# Patient Record
Sex: Female | Born: 1980 | Race: White | Hispanic: No | Smoking: Current every day smoker
Health system: Southern US, Community
[De-identification: ages and names within clinical notes are randomized; demographics above are authoritative.]

## PROBLEM LIST (undated history)

## (undated) DIAGNOSIS — F1920 Other psychoactive substance dependence, uncomplicated: Secondary | ICD-10-CM

## (undated) DIAGNOSIS — R569 Unspecified convulsions: Secondary | ICD-10-CM

## (undated) DIAGNOSIS — F419 Anxiety disorder, unspecified: Secondary | ICD-10-CM

---

## 2000-12-12 ENCOUNTER — Other Ambulatory Visit: Admission: RE | Admit: 2000-12-12 | Discharge: 2000-12-12 | Payer: Self-pay | Admitting: Obstetrics and Gynecology

## 2002-11-13 ENCOUNTER — Emergency Department (HOSPITAL_COMMUNITY): Admission: EM | Admit: 2002-11-13 | Discharge: 2002-11-13 | Payer: Self-pay | Admitting: Emergency Medicine

## 2004-02-06 ENCOUNTER — Ambulatory Visit (HOSPITAL_COMMUNITY): Admission: RE | Admit: 2004-02-06 | Discharge: 2004-02-06 | Payer: Self-pay | Admitting: Internal Medicine

## 2004-11-28 ENCOUNTER — Emergency Department (HOSPITAL_COMMUNITY): Admission: EM | Admit: 2004-11-28 | Discharge: 2004-11-28 | Payer: Self-pay | Admitting: Emergency Medicine

## 2005-01-08 ENCOUNTER — Emergency Department (HOSPITAL_COMMUNITY): Admission: EM | Admit: 2005-01-08 | Discharge: 2005-01-08 | Payer: Self-pay | Admitting: Emergency Medicine

## 2005-02-23 ENCOUNTER — Emergency Department (HOSPITAL_COMMUNITY): Admission: EM | Admit: 2005-02-23 | Discharge: 2005-02-23 | Payer: Self-pay | Admitting: Emergency Medicine

## 2005-03-24 ENCOUNTER — Emergency Department (HOSPITAL_COMMUNITY): Admission: EM | Admit: 2005-03-24 | Discharge: 2005-03-24 | Payer: Self-pay | Admitting: Emergency Medicine

## 2005-05-11 ENCOUNTER — Emergency Department (HOSPITAL_COMMUNITY): Admission: EM | Admit: 2005-05-11 | Discharge: 2005-05-11 | Payer: Self-pay | Admitting: Emergency Medicine

## 2005-08-10 ENCOUNTER — Emergency Department (HOSPITAL_COMMUNITY): Admission: EM | Admit: 2005-08-10 | Discharge: 2005-08-10 | Payer: Self-pay | Admitting: Emergency Medicine

## 2006-12-31 ENCOUNTER — Emergency Department (HOSPITAL_COMMUNITY): Admission: EM | Admit: 2006-12-31 | Discharge: 2006-12-31 | Payer: Self-pay | Admitting: Emergency Medicine

## 2007-04-25 ENCOUNTER — Emergency Department (HOSPITAL_COMMUNITY): Admission: EM | Admit: 2007-04-25 | Discharge: 2007-04-26 | Payer: Self-pay | Admitting: Emergency Medicine

## 2007-04-30 ENCOUNTER — Other Ambulatory Visit: Admission: RE | Admit: 2007-04-30 | Discharge: 2007-04-30 | Payer: Self-pay | Admitting: Obstetrics and Gynecology

## 2007-09-11 ENCOUNTER — Ambulatory Visit: Payer: Self-pay | Admitting: Obstetrics and Gynecology

## 2007-09-11 ENCOUNTER — Inpatient Hospital Stay (HOSPITAL_COMMUNITY): Admission: AD | Admit: 2007-09-11 | Discharge: 2007-09-11 | Payer: Self-pay | Admitting: Gynecology

## 2007-09-19 ENCOUNTER — Ambulatory Visit: Payer: Self-pay | Admitting: Family Medicine

## 2007-09-19 ENCOUNTER — Encounter: Payer: Self-pay | Admitting: Obstetrics & Gynecology

## 2007-09-19 ENCOUNTER — Inpatient Hospital Stay (HOSPITAL_COMMUNITY): Admission: AD | Admit: 2007-09-19 | Discharge: 2007-09-21 | Payer: Self-pay | Admitting: Obstetrics & Gynecology

## 2007-12-08 ENCOUNTER — Emergency Department (HOSPITAL_COMMUNITY): Admission: EM | Admit: 2007-12-08 | Discharge: 2007-12-08 | Payer: Self-pay | Admitting: Emergency Medicine

## 2008-02-17 ENCOUNTER — Emergency Department (HOSPITAL_COMMUNITY): Admission: EM | Admit: 2008-02-17 | Discharge: 2008-02-17 | Payer: Self-pay | Admitting: Emergency Medicine

## 2008-05-18 ENCOUNTER — Emergency Department (HOSPITAL_COMMUNITY): Admission: EM | Admit: 2008-05-18 | Discharge: 2008-05-18 | Payer: Self-pay | Admitting: Emergency Medicine

## 2008-06-13 ENCOUNTER — Emergency Department (HOSPITAL_COMMUNITY): Admission: EM | Admit: 2008-06-13 | Discharge: 2008-06-13 | Payer: Self-pay | Admitting: Emergency Medicine

## 2008-06-14 ENCOUNTER — Emergency Department (HOSPITAL_COMMUNITY): Admission: EM | Admit: 2008-06-14 | Discharge: 2008-06-14 | Payer: Self-pay | Admitting: Emergency Medicine

## 2008-08-16 ENCOUNTER — Emergency Department (HOSPITAL_COMMUNITY): Admission: EM | Admit: 2008-08-16 | Discharge: 2008-08-16 | Payer: Self-pay | Admitting: Emergency Medicine

## 2010-05-12 ENCOUNTER — Emergency Department (HOSPITAL_COMMUNITY)
Admission: EM | Admit: 2010-05-12 | Discharge: 2010-05-13 | Disposition: A | Discharge status: Home / Self Care | Payer: Medicaid Other | Attending: Emergency Medicine | Admitting: Emergency Medicine

## 2010-05-12 DIAGNOSIS — N390 Urinary tract infection, site not specified: Secondary | ICD-10-CM | POA: Insufficient documentation

## 2010-05-12 DIAGNOSIS — M549 Dorsalgia, unspecified: Secondary | ICD-10-CM | POA: Insufficient documentation

## 2010-05-12 LAB — URINALYSIS, ROUTINE W REFLEX MICROSCOPIC
Bilirubin Urine: NEGATIVE
Glucose, UA: NEGATIVE mg/dL
Hgb urine dipstick: NEGATIVE
Ketones, ur: NEGATIVE mg/dL
Nitrite: NEGATIVE
Protein, ur: NEGATIVE mg/dL
Specific Gravity, Urine: 1.015 (ref 1.005–1.030)
Urobilinogen, UA: 0.2 mg/dL (ref 0.0–1.0)

## 2010-05-12 LAB — URINE MICROSCOPIC-ADD ON

## 2010-05-14 ENCOUNTER — Emergency Department (HOSPITAL_COMMUNITY)
Admission: EM | Admit: 2010-05-14 | Discharge: 2010-05-14 | Disposition: A | Discharge status: Home / Self Care | Payer: Medicaid Other | Attending: Emergency Medicine | Admitting: Emergency Medicine

## 2010-05-14 DIAGNOSIS — R109 Unspecified abdominal pain: Secondary | ICD-10-CM | POA: Insufficient documentation

## 2010-05-14 DIAGNOSIS — O2390 Unspecified genitourinary tract infection in pregnancy, unspecified trimester: Secondary | ICD-10-CM | POA: Insufficient documentation

## 2010-05-14 LAB — URINALYSIS, ROUTINE W REFLEX MICROSCOPIC
Bilirubin Urine: NEGATIVE
Glucose, UA: NEGATIVE mg/dL
Hgb urine dipstick: NEGATIVE
Ketones, ur: NEGATIVE mg/dL
Nitrite: NEGATIVE
Protein, ur: NEGATIVE mg/dL
Specific Gravity, Urine: 1.015 (ref 1.005–1.030)
Urobilinogen, UA: 0.2 mg/dL (ref 0.0–1.0)
pH: 7 (ref 5.0–8.0)

## 2010-05-14 LAB — CBC
MCH: 30.6 pg (ref 26.0–34.0)
MCHC: 34.4 g/dL (ref 30.0–36.0)
MCV: 89.1 fL (ref 78.0–100.0)
Platelets: 198 10*3/uL (ref 150–400)
RBC: 3.66 MIL/uL — ABNORMAL LOW (ref 3.87–5.11)
RDW: 13.6 % (ref 11.5–15.5)
WBC: 11.2 10*3/uL — ABNORMAL HIGH (ref 4.0–10.5)

## 2010-05-14 LAB — DIFFERENTIAL
Basophils Absolute: 0 10*3/uL (ref 0.0–0.1)
Basophils Relative: 0 % (ref 0–1)
Eosinophils Absolute: 0.2 10*3/uL (ref 0.0–0.7)
Eosinophils Relative: 2 % (ref 0–5)
Lymphs Abs: 1.9 10*3/uL (ref 0.7–4.0)
Monocytes Absolute: 0.4 10*3/uL (ref 0.1–1.0)
Monocytes Relative: 4 % (ref 3–12)
Neutro Abs: 8.7 10*3/uL — ABNORMAL HIGH (ref 1.7–7.7)
Neutrophils Relative %: 77 % (ref 43–77)

## 2010-05-14 LAB — BASIC METABOLIC PANEL
BUN: 5 mg/dL — ABNORMAL LOW (ref 6–23)
CO2: 20 mEq/L (ref 19–32)
Chloride: 107 mEq/L (ref 96–112)
Creatinine, Ser: 0.4 mg/dL (ref 0.4–1.2)
GFR calc Af Amer: >60 mL/min (ref >60)
GFR calc non Af Amer: >60 mL/min (ref >60)
Glucose, Bld: 85 mg/dL (ref 70–99)
Potassium: 3.8 mEq/L (ref 3.5–5.1)
Sodium: 136 mEq/L (ref 135–145)

## 2010-05-14 LAB — PREGNANCY, URINE: Preg Test, Ur: POSITIVE

## 2010-05-14 LAB — URINE MICROSCOPIC-ADD ON

## 2010-05-15 LAB — URINE CULTURE
Colony Count: NO GROWTH
Culture  Setup Time: 201203270234
Culture: NO GROWTH

## 2010-05-17 ENCOUNTER — Emergency Department (HOSPITAL_COMMUNITY)
Admission: EM | Admit: 2010-05-17 | Discharge: 2010-05-17 | Discharge status: Against Medical Advice | Payer: Medicaid Other | Attending: Emergency Medicine | Admitting: Emergency Medicine

## 2010-05-17 DIAGNOSIS — O355XX0 Maternal care for (suspected) damage to fetus by drugs, not applicable or unspecified: Secondary | ICD-10-CM | POA: Insufficient documentation

## 2010-05-17 LAB — DIFFERENTIAL
Basophils Absolute: 0 10*3/uL (ref 0.0–0.1)
Basophils Relative: 0 % (ref 0–1)
Eosinophils Absolute: 0.2 10*3/uL (ref 0.0–0.7)
Eosinophils Relative: 2 % (ref 0–5)
Lymphocytes Relative: 19 % (ref 12–46)
Lymphs Abs: 2.6 10*3/uL (ref 0.7–4.0)
Monocytes Absolute: 0.6 10*3/uL (ref 0.1–1.0)
Neutro Abs: 9.9 10*3/uL — ABNORMAL HIGH (ref 1.7–7.7)
Neutrophils Relative %: 74 % (ref 43–77)

## 2010-05-17 LAB — CBC
HCT: 31.6 % — ABNORMAL LOW (ref 36.0–46.0)
Hemoglobin: 11 g/dL — ABNORMAL LOW (ref 12.0–15.0)
MCH: 31 pg (ref 26.0–34.0)
MCHC: 34.8 g/dL (ref 30.0–36.0)
Platelets: 218 10*3/uL (ref 150–400)
RBC: 3.55 MIL/uL — ABNORMAL LOW (ref 3.87–5.11)
RDW: 13.6 % (ref 11.5–15.5)
WBC: 13.3 10*3/uL — ABNORMAL HIGH (ref 4.0–10.5)

## 2010-05-17 LAB — BASIC METABOLIC PANEL
CO2: 23 mEq/L (ref 19–32)
Calcium: 8.8 mg/dL (ref 8.4–10.5)
Chloride: 103 mEq/L (ref 96–112)
Creatinine, Ser: 0.38 mg/dL — ABNORMAL LOW (ref 0.4–1.2)
GFR calc Af Amer: >60 mL/min (ref >60)
GFR calc non Af Amer: >60 mL/min (ref >60)
Glucose, Bld: 75 mg/dL (ref 70–99)
Potassium: 3.9 mEq/L (ref 3.5–5.1)
Sodium: 135 mEq/L (ref 135–145)

## 2010-05-17 LAB — RAPID URINE DRUG SCREEN, HOSP PERFORMED
Amphetamines: NOT DETECTED
Barbiturates: NOT DETECTED
Benzodiazepines: POSITIVE — AB
Cocaine: NOT DETECTED
Opiates: NOT DETECTED
Tetrahydrocannabinol: NOT DETECTED

## 2010-05-31 LAB — URINALYSIS, ROUTINE W REFLEX MICROSCOPIC
Bilirubin Urine: NEGATIVE
Glucose, UA: NEGATIVE mg/dL
Ketones, ur: NEGATIVE mg/dL
Nitrite: NEGATIVE
Protein, ur: NEGATIVE mg/dL
Specific Gravity, Urine: >1.03 — ABNORMAL HIGH (ref 1.005–1.030)
Urobilinogen, UA: 0.2 mg/dL (ref 0.0–1.0)
pH: 5.5 (ref 5.0–8.0)

## 2010-05-31 LAB — PREGNANCY, URINE: Preg Test, Ur: NEGATIVE

## 2010-05-31 LAB — WET PREP, GENITAL
Trich, Wet Prep: NONE SEEN
Yeast Wet Prep HPF POC: NONE SEEN

## 2010-05-31 LAB — URINE MICROSCOPIC-ADD ON

## 2010-06-07 ENCOUNTER — Other Ambulatory Visit (HOSPITAL_COMMUNITY)
Admission: RE | Admit: 2010-06-07 | Discharge: 2010-06-07 | Disposition: A | Discharge status: Home / Self Care | Payer: Medicaid Other | Source: Ambulatory Visit | Attending: Obstetrics and Gynecology | Admitting: Obstetrics and Gynecology

## 2010-06-07 ENCOUNTER — Other Ambulatory Visit: Payer: Self-pay | Admitting: Obstetrics and Gynecology

## 2010-06-07 DIAGNOSIS — Z361 Encounter for antenatal screening for raised alphafetoprotein level: Secondary | ICD-10-CM

## 2010-06-07 DIAGNOSIS — Z01419 Encounter for gynecological examination (general) (routine) without abnormal findings: Secondary | ICD-10-CM | POA: Insufficient documentation

## 2010-06-07 DIAGNOSIS — Z113 Encounter for screening for infections with a predominantly sexual mode of transmission: Secondary | ICD-10-CM | POA: Insufficient documentation

## 2010-06-12 ENCOUNTER — Ambulatory Visit (HOSPITAL_COMMUNITY): Payer: Self-pay | Attending: Obstetrics and Gynecology

## 2010-07-03 NOTE — Discharge Summary (Signed)
NAMEBRITTLEY, Shelly George                 ACCOUNT NO.:  [id number]   MEDICAL RECORD NO.:  [id number]          PATIENT TYPE:  INP   LOCATION:  9143                          FACILITY:  [facility]   PHYSICIAN:  Odie Sera, DO      DATE OF BIRTH:  04-22-1980   DATE OF ADMISSION:  09/19/2007  DATE OF DISCHARGE:  09/21/2007                               DISCHARGE SUMMARY   REASON FOR HOSPITALIZATION:  A preterm diamniotic-dichorionic twin  gestation, pregnancy with premature preterm rupture of membranes.   HOSPITAL COURSE:  The patient is a 30 year old now gravida 2, para 0-1-1-  2, who presented early in the morning on September 19, 2007, at 35-4/7  weeks' gestational age with report of a rupture of membranes.  She was  confirmed to have premature preterm ruptured membranes.  She was mildly  contracting at that time.  She was actively managed and her labor was  augmented with Pitocin.  When her cervix was completely dilated and  effaced, she began pushing efforts.  When the presenting twin was close  delivery, she was moved to the operating room where she delivered twin A  in a vertex occiput anterior position.  A small midline episiotomy was  cut due to exhaustive maternal efforts and a tight introitus.  Twin A  delivered without complications, and had a spontaneous cry upon birth.  He was bulb suctioned, the cord was clamped and cut, and he was handed  to the NICU Team.  An ultrasound was used to confirm persistent breech  presentation of twin B.  An internal podalic version was successfully  performed on twin B.  The feet were identified and brought down to the  vaginal opening.  During this process, the membranes were ruptured with  clear fluids.  The feet were then delivered followed by the hips, the  shoulders, and then the head was delivered with extension in the usual  manner.  Twin B was a female and had a spontaneous cry.  She was bulb  suctioned, the cord was clamped and cut, and she was  handed to the NICU  Team.  Both placentas delivered spontaneously, and intact, and were sent  to pathology.  A cord clamp was placed on twin B's placenta to allow for  identification of the placentas.  The midline episiotomy did not extend,  was repaired in the usual manner with 3-0 Vicryl.  Twin A weighed 2205 g  and had Apgars of 7 and 9.  Twin B had a weight of 1880 g and had Apgars  of 8 and 9.  The estimated blood loss of the delivery was 500 mL.  Mother was in good condition at the end of the procedure and the babies  were taken to the NICU for further evaluation.  The patient's postpartum  course was uncomplicated.  At discharge, her abdominal pain is minimal.  However, she has complained of some lower back and leg pain, mostly  related to a twin pregnancy, at the end of the pregnancy, as well as all  the efforts of labor and delivery.  She has minimal lochia, is  ambulating and tolerating oral diet, and is passing flatus.   PERTINENT LABORATORY DATA:  Blood type O positive, GBS initially was  unknown, then later found to be negative.  Her rubella is not immune,  and she will require an immunization after discharge.   FINAL DIAGNOSES:  1. Preterm diamniotic-dichorionic twin gestation pregnancy.  2. Premature preterm rupture of membranes.  3. A spontaneous vaginal delivery of twin babies.  4. Internal podalic version with breech extraction.   SIGNIFICANT FINDINGS:  None.   PROCEDURES PERFORMED:  Spontaneous vaginal delivery and internal podalic  version with breech extraction.   Condition upon discharge is good.   SPECIFIC INDICATIONS:  The patient received a Depo-Provera shot before  discharge.  She will follow up with [facility] in 6 weeks for routine  postpartum visit.  A routine instructions relating to physical activity,  medication, and diet were given to the patient.  Additionally, she was  given 20 tablets of Percocet for hip and back pain and instructed that  if  the pain persisted beyond a few days, she should seek followup with  her providers at [facility].   FINAL NOTE:  The twin B female is in the NICU at the time of discharge  and twin A has remained with the patient, so the patient will room-in  with her female baby for the time being.      Odie Sera, DO  Electronically Signed     MC/MEDQ  D:  09/21/2007  T:  09/22/2007  Job:  [id number]

## 2010-08-31 ENCOUNTER — Inpatient Hospital Stay (HOSPITAL_COMMUNITY)
Admission: AD | Admit: 2010-08-31 | Discharge: 2010-08-31 | Disposition: A | Discharge status: Home / Self Care | Payer: Medicaid Other | Source: Ambulatory Visit | Attending: Obstetrics and Gynecology | Admitting: Obstetrics and Gynecology

## 2010-08-31 ENCOUNTER — Emergency Department (HOSPITAL_COMMUNITY)
Admission: EM | Admit: 2010-08-31 | Discharge: 2010-08-31 | Disposition: A | Discharge status: Home / Self Care | Payer: Medicaid Other | Source: Home / Self Care | Attending: Emergency Medicine | Admitting: Emergency Medicine

## 2010-08-31 ENCOUNTER — Encounter (HOSPITAL_COMMUNITY): Payer: Self-pay | Admitting: *Deleted

## 2010-08-31 ENCOUNTER — Encounter: Payer: Self-pay | Admitting: Emergency Medicine

## 2010-08-31 ENCOUNTER — Emergency Department (HOSPITAL_COMMUNITY): Payer: Medicaid Other

## 2010-08-31 DIAGNOSIS — O479 False labor, unspecified: Secondary | ICD-10-CM

## 2010-08-31 DIAGNOSIS — J069 Acute upper respiratory infection, unspecified: Secondary | ICD-10-CM | POA: Insufficient documentation

## 2010-08-31 DIAGNOSIS — O471 False labor at or after 37 completed weeks of gestation: Secondary | ICD-10-CM

## 2010-08-31 DIAGNOSIS — F17200 Nicotine dependence, unspecified, uncomplicated: Secondary | ICD-10-CM | POA: Insufficient documentation

## 2010-08-31 HISTORY — DX: Anxiety disorder, unspecified: F41.9

## 2010-08-31 HISTORY — DX: Other psychoactive substance dependence, uncomplicated: F19.20

## 2010-08-31 MED ORDER — ZOLPIDEM TARTRATE 10 MG PO TABS
10.0000 mg | ORAL_TABLET | Freq: Every evening | ORAL | Status: DC | PRN
  Administered 2010-08-31: 10 mg via ORAL
  Filled 2010-08-31: qty 1

## 2010-08-31 NOTE — ED Provider Notes (Signed)
History      Chief Complaint  Patient presents with  . Cough   HPI  Per pt, c/o gradual onset and persistence of constant runny/stuffy nose, ears and sinus congestion, and cough x2 weeks.  Pt was eval by her OB/GYN last week for same, rx abx and "cough syrup" without relief.  States she is currently at term for her pregnancy and is "due today."  Denies CP/SOB, no fevers, no abd/pelvic pain, no N/V/D, no flank pain, no rash.   History reviewed. No pertinent past medical history.  History reviewed. No pertinent past surgical history.  History reviewed. No pertinent family history.  History  Substance Use Topics  . Smoking status: Current Everyday Smoker -- 0.5 packs/day  . Smokeless tobacco: Not on file  . Alcohol Use: No    OB History    Grav Para Term Preterm Abortions TAB SAB Ect Mult Living   1               Review of Systems  ROS: Statement: All systems negative except as marked or noted in the HPI; Constitutional: Negative for fever and chills. ; ; Eyes: Negative for eye pain and discharge. ; ; ENMT: Negative for ear pain, hoarseness, and sore throat, +nasal congestion, +sinus pressure. ; ; Cardiovascular: Negative for chest pain, palpitations, diaphoresis, dyspnea and peripheral edema. ; ; Respiratory: +cough, Negative for SOB, wheezing and stridor. ; ; Gastrointestinal: Negative for nausea, vomiting, diarrhea and abdominal pain. ; ; Genitourinary: Negative for dysuria, flank pain and hematuria. ; ; Musculoskeletal: Negative for back pain and neck pain. ; ; Skin: Negative for rash and skin lesion. ; ; Neuro: Negative for headache, lightheadedness and neck stiffness. ;    Physical Exam  BP 122/87  Pulse 111  Temp(Src) 97.9 F (36.6 C) (Oral)  Resp 20  Ht 5\' 6"  (1.676 m)  Wt 137 lb (62.143 kg)  BMI 22.11 kg/m2  SpO2 100%  Physical Exam  Physical examination: Vital signs and O2 SAT: Reviewed; Constitutional: Well developed, Well nourished, Well hydrated, In no acute  distress; Eyes: EOMI, PERRL, No scleral icterus; ENMT: Mouth and pharynx normal, Mucous membranes mois, +edemetous nasal turbinates bilat with clear rhinorrhea, +clear fluid levels behind TM's bilat.; Neck: Supple, Full range of motion, No lymphadenopathy; Cardiovascular: Regular rate and rhythm, No murmur, rub, or gallop; Respiratory: Breath sounds clear & equal bilaterally, resps easy, speaking full sentences with ease.  No rales, rhonchi, wheezes, or rub, Normal respiratory effort/excursion; Chest: Nontender, Movement normal; Abdomen: Soft, Nontender, Nondistended, Normal bowel sounds, +gravid; Genitourinary: No CVA tenderness; Extremities: Pulses normal, No tenderness, No edema, No calf edema or asymmetry.; Neuro: AA&Ox3, Major CN grossly intact.  No gross focal motor or sensory deficits in extremities, gait steady.; Skin: Color normal, Warm, Dry   ED Course  Procedures  Dx testing d/w pt.  Questions answered.  Verb understanding, agreeable to d/c home with outpt f/u.         Laray Anger, MD 08/31/10 [phone]

## 2010-08-31 NOTE — Progress Notes (Signed)
Pt states, " I was on the way here because I felt like I was in drug withdrawal. The last time I had Sivoxin wasthree weeks ago. I had a cough med for the past few days with hydrocodone in it. I started having contractions on the way here."

## 2010-08-31 NOTE — ED Provider Notes (Signed)
History    R/o labor.  Chief Complaint  Patient presents with  . Labor Eval  patient presented earlier with contractions q 3' mild to moderate. No bleeding or srom.   HPI   Past Medical History  Diagnosis Date  . Anxiety   . Drug abuse and dependence     No past surgical history on file.  Family History  Problem Relation Age of Onset  . Cancer Mother     History  Substance Use Topics  . Smoking status: Current Everyday Smoker -- 0.5 packs/day  . Smokeless tobacco: Not on file  . Alcohol Use: No    Allergies: No Known Allergies  Prescriptions prior to admission  Medication Sig Dispense Refill  . buprenorphine-naloxone (SUBOXONE) 2-0.5 MG SUBL Place 1 tablet under the tongue daily as needed.        Marland Kitchen HYDROcodone-homatropine (HYDROMET) 5-1.5 MG/5ML syrup Take 5 mLs by mouth every 6 (six) hours as needed.        . hydrocortisone (ANUSOL-HC) 2.5 % rectal cream Place 1 application rectally 2 (two) times daily.          ROS Physical Exam   Blood pressure 106/66, pulse 120, temperature 98.5 F (36.9 C), temperature source Oral, resp. rate 20, height 5\' 3"  (1.6 m), weight 62.143 kg (137 lb).  Physical Examalert oriented anxious, tolerating labor adequately.  Initial exam by Dr Natale Milch revealed cervix 2cm/50%/-2 station. Vertex.  Negative CST criteria met.  Allowed to labor, and recheck now at 8 p.m. Shows no change in cervix exam.  Contractions remain mild, indentable.  Will D/C home with dx: False Labor.  MAU Course  Procedures  MDM  R/o labor

## 2010-08-31 NOTE — ED Notes (Signed)
In pts room for assessment. Pt declined EFM at this time, plans to smoke

## 2010-08-31 NOTE — ED Notes (Signed)
Pt reports productive cough with green colored phlegm x 3 days.

## 2010-11-16 LAB — URINALYSIS, ROUTINE W REFLEX MICROSCOPIC
Glucose, UA: NEGATIVE
Hgb urine dipstick: NEGATIVE
Ketones, ur: >80 — AB
Protein, ur: NEGATIVE

## 2010-11-16 LAB — STREP B DNA PROBE: Strep Group B Ag: NEGATIVE

## 2010-11-16 LAB — CBC
Hemoglobin: 13.1
Platelets: 147 — ABNORMAL LOW
RDW: 14.1

## 2010-11-16 LAB — ABO/RH: ABO/RH(D): O POS

## 2010-11-16 LAB — TYPE AND SCREEN
ABO/RH(D): O POS
Antibody Screen: NEGATIVE

## 2010-11-27 LAB — RAPID URINE DRUG SCREEN, HOSP PERFORMED
Amphetamines: POSITIVE — AB
Barbiturates: NOT DETECTED
Benzodiazepines: POSITIVE — AB
Tetrahydrocannabinol: NOT DETECTED

## 2010-11-27 LAB — BASIC METABOLIC PANEL
CO2: 22
Calcium: 9.2
Creatinine, Ser: 0.72
GFR calc Af Amer: >60
Glucose, Bld: 107 — ABNORMAL HIGH

## 2010-11-27 LAB — DIFFERENTIAL
Eosinophils Absolute: 0.2
Eosinophils Relative: 2
Lymphocytes Relative: 32
Lymphs Abs: 3.3
Monocytes Absolute: 0.6
Monocytes Relative: 6

## 2010-11-27 LAB — VALPROIC ACID LEVEL: Valproic Acid Lvl: <10 — ABNORMAL LOW

## 2010-11-27 LAB — CBC: Platelets: 184

## 2010-11-27 LAB — PREGNANCY, URINE: Preg Test, Ur: NEGATIVE

## 2010-12-04 ENCOUNTER — Encounter (HOSPITAL_COMMUNITY): Payer: Self-pay | Admitting: *Deleted

## 2010-12-04 NOTE — Progress Notes (Signed)
Encounter addended by: Caffie Pinto, RN on: 12/04/2010  5:11 PM<BR>     Documentation filed: Episodes

## 2011-11-13 ENCOUNTER — Encounter (HOSPITAL_COMMUNITY): Payer: Self-pay | Admitting: Emergency Medicine

## 2011-11-13 ENCOUNTER — Emergency Department (HOSPITAL_COMMUNITY)
Admission: EM | Admit: 2011-11-13 | Discharge: 2011-11-13 | Disposition: A | Discharge status: Home / Self Care | Payer: Self-pay | Attending: Emergency Medicine | Admitting: Emergency Medicine

## 2011-11-13 DIAGNOSIS — Z046 Encounter for general psychiatric examination, requested by authority: Secondary | ICD-10-CM | POA: Insufficient documentation

## 2011-11-13 DIAGNOSIS — F1910 Other psychoactive substance abuse, uncomplicated: Secondary | ICD-10-CM

## 2011-11-13 DIAGNOSIS — F1110 Opioid abuse, uncomplicated: Secondary | ICD-10-CM | POA: Insufficient documentation

## 2011-11-13 DIAGNOSIS — Z139 Encounter for screening, unspecified: Secondary | ICD-10-CM

## 2011-11-13 DIAGNOSIS — F17200 Nicotine dependence, unspecified, uncomplicated: Secondary | ICD-10-CM | POA: Insufficient documentation

## 2011-11-13 DIAGNOSIS — Z888 Allergy status to other drugs, medicaments and biological substances status: Secondary | ICD-10-CM | POA: Insufficient documentation

## 2011-11-13 LAB — BASIC METABOLIC PANEL
Calcium: 9.8 mg/dL (ref 8.4–10.5)
Chloride: 103 mEq/L (ref 96–112)
Creatinine, Ser: 0.65 mg/dL (ref 0.50–1.10)
GFR calc Af Amer: >90 mL/min (ref >90)
Sodium: 138 mEq/L (ref 135–145)

## 2011-11-13 LAB — CBC WITH DIFFERENTIAL/PLATELET
Basophils Absolute: 0 10*3/uL (ref 0.0–0.1)
Basophils Relative: 0 % (ref 0–1)
HCT: 42.3 % (ref 36.0–46.0)
Lymphocytes Relative: 40 % (ref 12–46)
MCHC: 34.5 g/dL (ref 30.0–36.0)
Monocytes Absolute: 0.3 10*3/uL (ref 0.1–1.0)
Neutro Abs: 3.7 10*3/uL (ref 1.7–7.7)
Platelets: 241 10*3/uL (ref 150–400)
RDW: 12.8 % (ref 11.5–15.5)
WBC: 6.9 10*3/uL (ref 4.0–10.5)

## 2011-11-13 LAB — PREGNANCY, URINE: Preg Test, Ur: NEGATIVE

## 2011-11-13 LAB — RAPID URINE DRUG SCREEN, HOSP PERFORMED
Amphetamines: POSITIVE — AB
Cocaine: NOT DETECTED
Opiates: NOT DETECTED

## 2011-11-13 NOTE — ED Notes (Signed)
Pt was seen at [facility] for detox treatment, but her blood pressure 151/106 and needs medical clearance before she can be sent to [facility]. Pt states she takes opioids and last used on Friday.

## 2011-11-13 NOTE — BH Assessment (Signed)
Assessment Note  Shelly George is an 31 y.o. female. Patient sent to ED for medical clearance after being seen at [name]. She was in the process of being referred to [name], when the process was stopped. She had an elevated blood pressure. Called [facility] and spoke with Shelly George an RN in admissions. She said the patient did not have a bed because [name] not [facility] the referral. She explained that she could return to [name] 11/14/11 and start the process over. She was willing to talk with ACT, if we had completed an assessment. When we went to talk to Shelly George she was angry and refused to talk with [facility]. She said she only wanted her discharge paperwork and to return to [name]. She would not discuss anything else with this Clinical research associate. Axis I: Substance Abuse Axis II: Deferred Axis III:  Past Medical History  Diagnosis Date  . Anxiety   . Drug abuse and dependence    Axis IV: problems with access to health care services and problems with primary support group Axis V: 41-50 serious symptoms  Past Medical History:  Past Medical History  Diagnosis Date  . Anxiety   . Drug abuse and dependence     History reviewed. No pertinent past surgical history.  Family History:  Family History  Problem Relation Age of Onset  . Cancer Mother     Social History:  reports that she has been smoking.  She does not have any smokeless tobacco history on file. She reports that she uses illicit drugs (Oxycodone). She reports that she does not drink alcohol.  Additional Social History:     CIWA: CIWA-Ar BP: 144/99 mmHg Pulse Rate: 102  COWS:    Allergies:  Allergies  Allergen Reactions  . Tegretol (Carbamazepine) Nausea Only    Home Medications:  (Not in a George admission)  OB/GYN Status:  Patient's last menstrual period was 11/10/2011.  General Assessment Data Location of Assessment: [facility] ACT Assessment: Yes     Risk to self Suicidal Ideation: No Suicidal Intent: No Is  patient at risk for suicide?: No Suicidal Plan?: No Access to Means: No What has been your use of drugs/alcohol within the last 12 months?: opiates Previous Attempts/Gestures: No Substance abuse history and/or treatment for substance abuse?: Yes  Risk to Others Homicidal Ideation: No Thoughts of Harm to Others: No Current Homicidal Intent: No Current Homicidal Plan: No History of harm to others?: No  Psychosis Hallucinations: None noted Delusions: None noted  Mental Status Report Appear/Hygiene: Improved Eye Contact: Fair Motor Activity: Freedom of movement Speech: Argumentative Level of Consciousness: Alert Mood: Angry;Irritable Affect: Angry;Irritable              Prior Outpatient Therapy Prior Outpatient Therapy: Yes Prior Therapy Dates: today ([facility] at [name]) Prior Therapy Facilty/Provider(s): [name] Reason for Treatment: detox            Values / Beliefs Cultural Requests During Hospitalization: None Spiritual Requests During Hospitalization: None        Additional Information Does patient have medical clearance?: Yes     Disposition: Explained patient's request to Shelly George and  That patient had refused to talk with this writer. Ptient will return to [name] at her request. Disposition Disposition of Patient: Other dispositions;Treatment offered and refused Other disposition(s): Other (Comment) (patient refused to be seen, stated she wanted to return to [facility])  On Site Evaluation by:  Reviewed with Physician:     Shelly George Shelly George 11/13/2011 3:27 PM

## 2011-11-13 NOTE — ED Provider Notes (Signed)
History     CSN: [id number]  Arrival date & time 11/13/11  1135   First MD Initiated Contact with Patient 11/13/11 1316      Chief Complaint  Patient presents with  . Hypertension  . V70.1     HPI Pt was seen at 1340.  Per pt, c/o gradual onset and persistence of constant substance abuse for many years.  States she was eval at [facility] for admission to [facility] for detox from suboxone and opiates PTA, and was sent to the ED for eval of "my blood pressure."  LD of suboxone and opiates 5 days ago. Denies CP/SOB, no abd pain, no N/V/D, no SI.     Past Medical History  Diagnosis Date  . Anxiety   . Drug abuse and dependence     History reviewed. No pertinent past surgical history.  Family History  Problem Relation Age of Onset  . Cancer Mother     History  Substance Use Topics  . Smoking status: Current Every Day Smoker -- 0.5 packs/day  . Smokeless tobacco: Not on file  . Alcohol Use: No    OB History    Grav Para Term Preterm Abortions TAB SAB Ect Mult Living   3 2  1     1 2       Review of Systems ROS: Statement: All systems negative except as marked or noted in the HPI; Constitutional: Negative for fever and chills. ; ; Eyes: Negative for eye pain, redness and discharge. ; ; ENMT: Negative for ear pain, hoarseness, nasal congestion, sinus pressure and sore throat. ; ; Cardiovascular: Negative for chest pain, palpitations, diaphoresis, dyspnea and peripheral edema. ; ; Respiratory: Negative for cough, wheezing and stridor. ; ; Gastrointestinal: Negative for nausea, vomiting, diarrhea, abdominal pain, blood in stool, hematemesis, jaundice and rectal bleeding. . ; ; Genitourinary: Negative for dysuria, flank pain and hematuria. ; ; Musculoskeletal: Negative for back pain and neck pain. Negative for swelling and trauma.; ; Skin: Negative for pruritus, rash, abrasions, blisters, bruising and skin lesion.; ; Neuro: Negative for headache, lightheadedness and neck stiffness. Negative  for weakness, altered level of consciousness , altered mental status, extremity weakness, paresthesias, involuntary movement, seizure and syncope.; Psych:  No SI, no SA, no HI, no hallucinations.   Allergies  Tegretol  Home Medications   Current Outpatient Rx  Name Route Sig Dispense Refill  . ETONOGESTREL 68 MG [place] IMPL Subcutaneous Inject 1 each into the skin once.      BP 144/99  Pulse 102  Temp 98.7 F (37.1 C) (Oral)  Resp 17  SpO2 100%  LMP 11/10/2011  Physical Exam 1345: Physical examination:  Nursing notes reviewed; Vital signs and O2 SAT reviewed;  Constitutional: Well developed, Well nourished, Well hydrated, In no acute distress; Head:  Normocephalic, atraumatic; Eyes: EOMI, PERRL, No scleral icterus; ENMT: Mouth and pharynx normal, Mucous membranes moist; Neck: Supple, Full range of motion, No lymphadenopathy; Cardiovascular: Regular rate and rhythm, No murmur, rub, or gallop; Respiratory: Breath sounds clear & equal bilaterally, No rales, rhonchi, wheezes.  Speaking full sentences with ease, Normal respiratory effort/excursion; Chest: Nontender, Movement normal; Abdomen: Soft, Nontender, Nondistended, Normal bowel sounds;; Extremities: Pulses normal, No tenderness, No edema, No calf edema or asymmetry.; Neuro: AA&Ox3, Major CN grossly intact.  Speech clear. No gross focal motor or sensory deficits in extremities.; Skin: Color normal, Warm, Dry.; Psych:  Calm, cooperative, no SI, no HI.     ED Course  Procedures    MDM  MDM Reviewed: nursing note and vitals Interpretation: labs     Results for orders placed during the hospital encounter of 11/13/11  CBC WITH DIFFERENTIAL      Component Value Range   WBC 6.9  4.0 - 10.5 K/uL   RBC 4.95  3.87 - 5.11 MIL/uL   Hemoglobin 14.6  12.0 - 15.0 g/dL   HCT 40.9  81.1 - 91.4 %   MCV 85.5  78.0 - 100.0 fL   MCH 29.5  26.0 - 34.0 pg   MCHC 34.5  30.0 - 36.0 g/dL   RDW 78.2  95.6 - 21.3 %   Platelets 241  150 - 400 K/uL     Neutrophils Relative 54  43 - 77 %   Neutro Abs 3.7  1.7 - 7.7 K/uL   Lymphocytes Relative 40  12 - 46 %   Lymphs Abs 2.7  0.7 - 4.0 K/uL   Monocytes Relative 5  3 - 12 %   Monocytes Absolute 0.3  0.1 - 1.0 K/uL   Eosinophils Relative 1  0 - 5 %   Eosinophils Absolute 0.1  0.0 - 0.7 K/uL   Basophils Relative 0  0 - 1 %   Basophils Absolute 0.0  0.0 - 0.1 K/uL  BASIC METABOLIC PANEL      Component Value Range   Sodium 138  135 - 145 mEq/L   Potassium 3.6  3.5 - 5.1 mEq/L   Chloride 103  96 - 112 mEq/L   CO2 23  19 - 32 mEq/L   Glucose, Bld 82  70 - 99 mg/dL   BUN 8  6 - 23 mg/dL   Creatinine, Ser 0.86  0.50 - 1.10 mg/dL   Calcium 9.8  8.4 - 57.8 mg/dL   GFR calc non Af Amer >90  >90 mL/min   GFR calc Af Amer >90  >90 mL/min  URINE RAPID DRUG SCREEN (HOSP PERFORMED)      Component Value Range   Opiates NONE DETECTED  NONE DETECTED   Cocaine NONE DETECTED  NONE DETECTED   Benzodiazepines NONE DETECTED  NONE DETECTED   Amphetamines POSITIVE (*) NONE DETECTED   Tetrahydrocannabinol NONE DETECTED  NONE DETECTED   Barbiturates NONE DETECTED  NONE DETECTED  ETHANOL      Component Value Range   Alcohol, Ethyl (B) <11  0 - 11 mg/dL  PREGNANCY, URINE      Component Value Range   Preg Test, Ur NEGATIVE  NEGATIVE     1530:  ACT Tommy attempted to eval pt; she does not want to speak with him nor have the ED assist her with placement at [facility].  States she wants to go back to [facility] for "all that" and wants to leave now.  No outward signs of opiate withdrawal at this time.  BP 130/90's on my re-eval.  Denies SI.  Encouraged to f/u with [facility] and PMD.  Verb understanding.          Laray Anger, DO 11/15/11 1247

## 2011-11-13 NOTE — ED Notes (Signed)
Pt denies any si/hi thoughts at this time.

## 2011-11-14 ENCOUNTER — Encounter (HOSPITAL_COMMUNITY): Payer: Self-pay | Admitting: Emergency Medicine

## 2011-11-14 ENCOUNTER — Emergency Department (HOSPITAL_COMMUNITY)
Admission: EM | Admit: 2011-11-14 | Discharge: 2011-11-14 | Disposition: A | Discharge status: Home / Self Care | Payer: Self-pay | Attending: Emergency Medicine | Admitting: Emergency Medicine

## 2011-11-14 DIAGNOSIS — Z0389 Encounter for observation for other suspected diseases and conditions ruled out: Secondary | ICD-10-CM | POA: Insufficient documentation

## 2011-11-14 LAB — RAPID URINE DRUG SCREEN, HOSP PERFORMED
Barbiturates: NOT DETECTED
Cocaine: NOT DETECTED
Opiates: NOT DETECTED
Tetrahydrocannabinol: NOT DETECTED

## 2011-11-14 NOTE — ED Notes (Signed)
Pt stated to nt joyce that she wished to sign herself out. rn entered room and asked pt if she was si/hi she stated no, just needed detox but was ready to go and wanted to sign herself out.  Pt signed form and left without incident.

## 2011-11-14 NOTE — ED Notes (Signed)
Pt states she was detox from opioids and she last used Friday. Pt denies any si/hi.

## 2013-03-12 ENCOUNTER — Emergency Department (HOSPITAL_COMMUNITY)
Admission: EM | Admit: 2013-03-12 | Discharge: 2013-03-12 | Disposition: A | Discharge status: Home / Self Care | Payer: Managed Care, Other (non HMO) | Attending: Emergency Medicine | Admitting: Emergency Medicine

## 2013-03-12 ENCOUNTER — Encounter (HOSPITAL_COMMUNITY): Payer: Self-pay | Admitting: Emergency Medicine

## 2013-03-12 DIAGNOSIS — F17200 Nicotine dependence, unspecified, uncomplicated: Secondary | ICD-10-CM | POA: Insufficient documentation

## 2013-03-12 DIAGNOSIS — W57XXXA Bitten or stung by nonvenomous insect and other nonvenomous arthropods, initial encounter: Secondary | ICD-10-CM

## 2013-03-12 DIAGNOSIS — IMO0001 Reserved for inherently not codable concepts without codable children: Secondary | ICD-10-CM | POA: Insufficient documentation

## 2013-03-12 DIAGNOSIS — Y939 Activity, unspecified: Secondary | ICD-10-CM | POA: Insufficient documentation

## 2013-03-12 DIAGNOSIS — Y929 Unspecified place or not applicable: Secondary | ICD-10-CM | POA: Insufficient documentation

## 2013-03-12 DIAGNOSIS — Z8659 Personal history of other mental and behavioral disorders: Secondary | ICD-10-CM | POA: Insufficient documentation

## 2013-03-12 MED ORDER — FAMOTIDINE 20 MG PO TABS
20.0000 mg | ORAL_TABLET | Freq: Once | ORAL | Status: AC
  Administered 2013-03-12: 20 mg via ORAL
  Filled 2013-03-12: qty 1

## 2013-03-12 MED ORDER — PREDNISONE 50 MG PO TABS
60.0000 mg | ORAL_TABLET | Freq: Once | ORAL | Status: AC
  Administered 2013-03-12: 60 mg via ORAL
  Filled 2013-03-12 (×2): qty 1

## 2013-03-12 MED ORDER — PREDNISONE 20 MG PO TABS: ORAL_TABLET | ORAL | Status: DC

## 2013-03-12 MED ORDER — FAMOTIDINE 20 MG PO TABS: 20.0000 mg | ORAL_TABLET | Freq: Two times a day (BID) | ORAL | Status: DC

## 2013-03-12 NOTE — ED Notes (Signed)
Pt c/o abscess to left arm x 2 days.

## 2013-03-12 NOTE — ED Provider Notes (Signed)
CSN: [id number]     Arrival date & time 03/12/13  0740 History   First MD Initiated Contact with Patient 03/12/13 [phone]     Chief Complaint  Patient presents with  . Abscess   (Consider location/radiation/quality/duration/timing/severity/associated sxs/prior Treatment) HPI Comments: Shelly George is a 33 y.o. female who presents to the Emergency Department complaining of possible insect bite to the left elbow.  She c/o itching and redness to her elbow that began yesterday.  She states that she felt something bite or sting her when she put her jacket on.  She has taken benadryl and cleaned the area with hydrogen peroxide.  She denies swelling, pain with movement, numbness, fever, chills or other bites or rashes  The history is provided by the patient.    Past Medical History  Diagnosis Date  . Anxiety   . Drug abuse and dependence     opiates, suboxone   History reviewed. No pertinent past surgical history. Family History  Problem Relation Age of Onset  . Cancer Mother    History  Substance Use Topics  . Smoking status: Current Every Day Smoker -- 0.50 packs/day  . Smokeless tobacco: Not on file  . Alcohol Use: No   OB History   Grav Para Term Preterm Abortions TAB SAB Ect Mult Living   3 2  1     1 2      Review of Systems  Constitutional: Negative for fever and chills.  HENT: Negative for trouble swallowing.   Gastrointestinal: Negative for nausea and vomiting.  Musculoskeletal: Negative for arthralgias, joint swelling and myalgias.  Skin: Positive for color change.       Red, itching area to the left elbow  Neurological: Negative for dizziness.  Hematological: Negative for adenopathy.  All other systems reviewed and are negative.    Allergies  Tegretol  Home Medications   Current Outpatient Rx  Name  Route  Sig  Dispense  Refill  . etonogestrel (IMPLANON) 68 MG IMPL implant   Subcutaneous   Inject 1 each into the skin once.          BP 147/84  Pulse 105   Temp(Src) 98.5 F (36.9 C)  Resp 16  Ht 5\' 4"  (1.626 m)  Wt 112 lb (50.803 kg)  BMI 19.22 kg/m2  SpO2 99%  LMP 02/19/2013 Physical Exam  Nursing note and vitals reviewed. Constitutional: She is oriented to person, place, and time. She appears well-developed and well-nourished. No distress.  HENT:  Head: Normocephalic and atraumatic.  Mouth/Throat: Oropharynx is clear and moist.  Neck: Normal range of motion. Neck supple.  Cardiovascular: Normal rate, regular rhythm, normal heart sounds and intact distal pulses.   No murmur heard. Pulmonary/Chest: Effort normal and breath sounds normal. No respiratory distress.  Musculoskeletal: Normal range of motion. She exhibits no edema and no tenderness.  Pt has full ROM of the left elbow.  No edema of the joint.  Radial pulse brisk, distal sensation intact  Lymphadenopathy:    She has no cervical adenopathy.  Neurological: She is alert and oriented to person, place, and time. She exhibits normal muscle tone. Coordination normal.  Skin: Skin is warm. Rash noted. Rash is urticarial. Rash is not macular, not papular, not nodular, not pustular and not vesicular. There is erythema.     Localized 5 cm area of erythema to the lateral left elbow and forearm.  No edema.  No pustule, vesicles or drainage.    ED Course  Procedures (including critical  care time) Labs Review Labs Reviewed - No data to display Imaging Review No results found.  EKG Interpretation   None       MDM   area of erythema to the left lateral elbow appears c/w localized allergic reaction, no abscess.  Pt agrees to benadryl, prednisone taper and pepcid.  No edema or airway compromise.  Pt stable for d/c.  Agrees to return if needed.   Luevenia Mcavoy L. Devi Hopman, PA-C 03/14/13 1344

## 2013-03-12 NOTE — Discharge Instructions (Signed)
Insect Bite °Mosquitoes, flies, fleas, bedbugs, and other insects can bite. Insect bites are different from insect stings. The bite may be red, puffy (swollen), and itchy for 2 to 4 days. Most bites get better on their own. °HOME CARE  °· Do not scratch the bite. °· Keep the bite clean and dry. Wash the bite with soap and water. °· Put ice on the bite. °· Put ice in a plastic bag. °· Place a towel between your skin and the bag. °· Leave the ice on for 20 minutes, 4 times a day. Do this for the first 2 to 3 days, or as told by your doctor. °· You may use medicated lotions or creams to lessen itching as told by your doctor. °· Only take medicines as told by your doctor. °· If you are given medicines (antibiotics), take them as told. Finish them even if you start to feel better. °You may need a tetanus shot if: °· You cannot remember when you had your last tetanus shot. °· You have never had a tetanus shot. °· The injury broke your skin. °If you need a tetanus shot and you choose not to have one, you may get tetanus. Sickness from tetanus can be serious. °GET HELP RIGHT AWAY IF:  °· You have more pain, redness, or puffiness. °· You see a red line on the skin coming from the bite. °· You have a fever. °· You have joint pain. °· You have a headache or neck pain. °· You feel weak. °· You have a rash. °· You have chest pain, or you are short of breath. °· You have belly (abdominal) pain. °· You feel sick to your stomach (nauseous) or throw up (vomit). °· You feel very tired or sleepy. °MAKE SURE YOU:  °· Understand these instructions. °· Will watch your condition. °· Will get help right away if you are not doing well or get worse. °Document Released: 02/02/2000 Document Revised: 04/29/2011 Document Reviewed: 09/05/2010 °[employer]® Patient Information ©2014 [employer]. ° °

## 2013-03-15 NOTE — ED Provider Notes (Signed)
Medical screening examination/treatment/procedure(s) were performed by non-physician practitioner and as supervising physician I was immediately available for consultation/collaboration.  EKG Interpretation   None         Tiffinie Caillier M Gabino Hagin, DO 03/15/13 0711 

## 2013-03-25 ENCOUNTER — Emergency Department (HOSPITAL_COMMUNITY)
Admission: EM | Admit: 2013-03-25 | Discharge: 2013-03-26 | Disposition: A | Discharge status: Home / Self Care | Payer: Managed Care, Other (non HMO) | Attending: Emergency Medicine | Admitting: Emergency Medicine

## 2013-03-25 ENCOUNTER — Encounter (HOSPITAL_COMMUNITY): Payer: Self-pay | Admitting: Emergency Medicine

## 2013-03-25 DIAGNOSIS — F419 Anxiety disorder, unspecified: Secondary | ICD-10-CM

## 2013-03-25 DIAGNOSIS — F445 Conversion disorder with seizures or convulsions: Secondary | ICD-10-CM

## 2013-03-25 DIAGNOSIS — R569 Unspecified convulsions: Secondary | ICD-10-CM | POA: Insufficient documentation

## 2013-03-25 DIAGNOSIS — F17200 Nicotine dependence, unspecified, uncomplicated: Secondary | ICD-10-CM | POA: Insufficient documentation

## 2013-03-25 DIAGNOSIS — F411 Generalized anxiety disorder: Secondary | ICD-10-CM | POA: Insufficient documentation

## 2013-03-25 DIAGNOSIS — IMO0002 Reserved for concepts with insufficient information to code with codable children: Secondary | ICD-10-CM | POA: Insufficient documentation

## 2013-03-25 HISTORY — DX: Unspecified convulsions: R56.9

## 2013-03-25 MED ORDER — LORAZEPAM 1 MG PO TABS
1.0000 mg | ORAL_TABLET | Freq: Once | ORAL | Status: AC
  Administered 2013-03-25: 1 mg via ORAL
  Filled 2013-03-25: qty 1

## 2013-03-25 NOTE — ED Provider Notes (Signed)
CSN: [id number]     Arrival date & time 03/25/13  2236 History  This chart was scribed for Lyanne CoKevin M Danie Diehl, MD by Quintella ReichertMatthew Underwood, ED scribe.  This patient was seen in room APA19/APA19 and the patient's care was started at 11:41 PM.   Chief Complaint  Patient presents with  . Seizures    The history is provided by the patient. No language interpreter was used.    HPI Comments: Shelly George is a 33 y.o. female brought in by ambulance to the Emergency Department complaining of a "stress-induced seizure" that occurred prior to arrival.  Pt states she has h/o the same and is stressed currently due to relationship issues.  She describes the seizure as initially "jerking and shaking" in both arms, and later developing slurred speech.  She denies LOC.  Currently she sates she feels generally weak and mildly dizzy.  She denies focal weakness or pain to any area.  Pt states her seizure tonight was similar to prior stress-induced seizures.  She states she was not formally diagnosed with stress-induced seizures but has had an EEG and MRI done which did not show anything, and states they only come on when she is stressed.  She does not have epilepsy to her knowledge.  She was on Depakote but was taken off.  She does not see a neurologist or psychiatrist or take any psychiatric medications.  She is requesting "something to help me so I don't keep doing this."     Past Medical History  Diagnosis Date  . Anxiety   . Drug abuse and dependence     opiates, suboxone  . Seizure     History reviewed. No pertinent past surgical history.   Family History  Problem Relation Age of Onset  . Cancer Mother     History  Substance Use Topics  . Smoking status: Current Every Day Smoker -- 0.50 packs/day  . Smokeless tobacco: Not on file  . Alcohol Use: No    OB History   Grav Para Term Preterm Abortions TAB SAB Ect Mult Living   3 2  1     1 2       Review of Systems A complete 10 system review of  systems was obtained and all systems are negative except as noted in the HPI and PMH.    Allergies  Codeine and Tegretol  Home Medications   Current Outpatient Rx  Name  Route  Sig  Dispense  Refill  . etonogestrel (IMPLANON) 68 MG IMPL implant   Subcutaneous   Inject 1 each into the skin once.         . famotidine (PEPCID) 20 MG tablet   Oral   Take 1 tablet (20 mg total) by mouth 2 (two) times daily. For 7 days   14 tablet   0   . predniSONE (DELTASONE) 20 MG tablet      Take 3 tablets po qd x 2 days, then 2 tablets po qd x 2 days, then 1 tablet po qd x 2 days   12 tablet   0    BP 135/91  Pulse 71  Temp(Src) 98.3 F (36.8 C) (Oral)  Resp 18  Ht 5\' 4"  (1.626 m)  Wt 112 lb (50.803 kg)  BMI 19.22 kg/m2  SpO2 99%  LMP 02/19/2013  Physical Exam  Nursing note and vitals reviewed. Constitutional: She is oriented to person, place, and time. She appears well-developed and well-nourished.  HENT:  Head: Normocephalic and  atraumatic.  Eyes: Pupils are equal, round, and reactive to light.  Cardiovascular: Regular rhythm.   Pulmonary/Chest: Effort normal.  Abdominal: Soft.  Musculoskeletal: Normal range of motion.  Neurological: She is alert and oriented to person, place, and time.  5/5 strength in major muscle groups of  bilateral upper and lower extremities. Speech normal. No facial asymetry.     ED Course  Procedures (including critical care time)  DIAGNOSTIC STUDIES: Oxygen Saturation is 99% on room air, normal by my interpretation.    COORDINATION OF CARE: 11:46 PM-Discussed treatment plan which includes Ativan in the ED and behavioral health f/u for continued medication management.  Pt expressed understanding and agreed with plan.     Labs Review Labs Reviewed - No data to display  Imaging Review No results found.  EKG Interpretation   None       MDM   1. Anxiety   2. Pseudoseizure    1:46 AM Feels better at this time. Dc home. Neuro follow  up    I personally performed the services described in this documentation, which was scribed in my presence. The recorded information has been reviewed and is accurate.      Lyanne Co, MD 03/26/13 [phone]

## 2013-03-25 NOTE — ED Notes (Signed)
Patient denies any SI or HI.  Patient seems anxious.
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
## 2013-12-20 ENCOUNTER — Encounter (HOSPITAL_COMMUNITY): Payer: Self-pay | Admitting: Emergency Medicine

## 2014-02-22 ENCOUNTER — Encounter (HOSPITAL_COMMUNITY): Payer: Self-pay | Admitting: Emergency Medicine

## 2014-02-22 ENCOUNTER — Emergency Department (HOSPITAL_COMMUNITY)
Admission: EM | Admit: 2014-02-22 | Discharge: 2014-02-23 | Disposition: A | Discharge status: Home / Self Care | Payer: MEDICAID | Attending: Emergency Medicine | Admitting: Emergency Medicine

## 2014-02-22 DIAGNOSIS — Z720 Tobacco use: Secondary | ICD-10-CM | POA: Insufficient documentation

## 2014-02-22 DIAGNOSIS — F1310 Sedative, hypnotic or anxiolytic abuse, uncomplicated: Secondary | ICD-10-CM | POA: Insufficient documentation

## 2014-02-22 DIAGNOSIS — F1910 Other psychoactive substance abuse, uncomplicated: Secondary | ICD-10-CM

## 2014-02-22 DIAGNOSIS — F1510 Other stimulant abuse, uncomplicated: Secondary | ICD-10-CM | POA: Insufficient documentation

## 2014-02-22 LAB — COMPREHENSIVE METABOLIC PANEL
ALBUMIN: 4.4 g/dL (ref 3.5–5.2)
ALT: 32 U/L (ref 0–35)
ANION GAP: 10 (ref 5–15)
AST: 28 U/L (ref 0–37)
Alkaline Phosphatase: 74 U/L (ref 39–117)
BUN: 26 mg/dL — ABNORMAL HIGH (ref 6–23)
CALCIUM: 9.1 mg/dL (ref 8.4–10.5)
CO2: 22 mmol/L (ref 19–32)
CREATININE: 0.64 mg/dL (ref 0.50–1.10)
Chloride: 105 mEq/L (ref 96–112)
GFR calc non Af Amer: >90 mL/min (ref >90)
Glucose, Bld: 87 mg/dL (ref 70–99)
Potassium: 3.5 mmol/L (ref 3.5–5.1)
SODIUM: 137 mmol/L (ref 135–145)
TOTAL PROTEIN: 7.6 g/dL (ref 6.0–8.3)
Total Bilirubin: 0.7 mg/dL (ref 0.3–1.2)

## 2014-02-22 LAB — CBC WITH DIFFERENTIAL/PLATELET
BASOS ABS: 0 10*3/uL (ref 0.0–0.1)
BASOS PCT: 0 % (ref 0–1)
EOS PCT: 1 % (ref 0–5)
Eosinophils Absolute: 0.1 10*3/uL (ref 0.0–0.7)
HEMATOCRIT: 35.2 % — AB (ref 36.0–46.0)
Hemoglobin: 11.9 g/dL — ABNORMAL LOW (ref 12.0–15.0)
Lymphocytes Relative: 29 % (ref 12–46)
Lymphs Abs: 2.3 10*3/uL (ref 0.7–4.0)
MCH: 29.2 pg (ref 26.0–34.0)
MCHC: 33.8 g/dL (ref 30.0–36.0)
MCV: 86.5 fL (ref 78.0–100.0)
MONO ABS: 0.5 10*3/uL (ref 0.1–1.0)
Monocytes Relative: 6 % (ref 3–12)
Neutro Abs: 5.1 10*3/uL (ref 1.7–7.7)
Neutrophils Relative %: 64 % (ref 43–77)
PLATELETS: 209 10*3/uL (ref 150–400)
RBC: 4.07 MIL/uL (ref 3.87–5.11)
RDW: 13.5 % (ref 11.5–15.5)
WBC: 8 10*3/uL (ref 4.0–10.5)

## 2014-02-22 LAB — URINE MICROSCOPIC-ADD ON

## 2014-02-22 LAB — URINALYSIS, ROUTINE W REFLEX MICROSCOPIC
GLUCOSE, UA: NEGATIVE mg/dL
Hgb urine dipstick: NEGATIVE
KETONES UR: 40 mg/dL — AB
Nitrite: NEGATIVE
PROTEIN: NEGATIVE mg/dL
Specific Gravity, Urine: >1.03 — ABNORMAL HIGH (ref 1.005–1.030)
Urobilinogen, UA: 0.2 mg/dL (ref 0.0–1.0)
pH: 5 (ref 5.0–8.0)

## 2014-02-22 LAB — RAPID URINE DRUG SCREEN, HOSP PERFORMED
AMPHETAMINES: POSITIVE — AB
BENZODIAZEPINES: POSITIVE — AB
Barbiturates: NOT DETECTED
COCAINE: NOT DETECTED
OPIATES: NOT DETECTED
TETRAHYDROCANNABINOL: NOT DETECTED

## 2014-02-22 LAB — ETHANOL

## 2014-02-22 LAB — ACETAMINOPHEN LEVEL

## 2014-02-22 LAB — SALICYLATE LEVEL

## 2014-02-22 MED ORDER — DIPHENHYDRAMINE HCL 25 MG PO CAPS
25.0000 mg | ORAL_CAPSULE | Freq: Once | ORAL | Status: AC
Start: 2014-02-22 — End: 2014-02-22
  Administered 2014-02-22: 25 mg via ORAL
  Filled 2014-02-22: qty 1

## 2014-02-22 MED ORDER — NICOTINE 14 MG/24HR TD PT24
14.0000 mg | MEDICATED_PATCH | Freq: Once | TRANSDERMAL | Status: DC
  Administered 2014-02-22: 14 mg via TRANSDERMAL
  Filled 2014-02-22: qty 1
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
## 2014-02-22 NOTE — BH Assessment (Signed)
Writer informed TTS Terri of the consult.  

## 2014-02-22 NOTE — ED Notes (Signed)
Pt IVC by husband. Paperwork vague as to why IVC filled out other than pt is possible harm to self and others.

## 2014-02-22 NOTE — ED Notes (Signed)
[facility] [place] [facility]

## 2014-02-23 DIAGNOSIS — F411 Generalized anxiety disorder: Secondary | ICD-10-CM

## 2014-02-23 DIAGNOSIS — F332 Major depressive disorder, recurrent severe without psychotic features: Secondary | ICD-10-CM

## 2014-02-23 DIAGNOSIS — F1910 Other psychoactive substance abuse, uncomplicated: Secondary | ICD-10-CM | POA: Insufficient documentation

## 2014-02-23 MED ORDER — ACETAMINOPHEN 325 MG PO TABS: 650.0000 mg | ORAL_TABLET | ORAL | Status: DC | PRN

## 2014-02-23 MED ORDER — ALUM & MAG HYDROXIDE-SIMETH 200-200-20 MG/5ML PO SUSP: 30.0000 mL | ORAL | Status: DC | PRN

## 2014-02-23 MED ORDER — ONDANSETRON HCL 4 MG PO TABS: 4.0000 mg | ORAL_TABLET | Freq: Three times a day (TID) | ORAL | Status: DC | PRN

## 2014-02-23 MED ORDER — METHOCARBAMOL 500 MG PO TABS: 1000.0000 mg | ORAL_TABLET | Freq: Four times a day (QID) | ORAL | Status: DC | PRN

## 2014-02-23 MED ORDER — HYDROXYZINE HCL 25 MG PO TABS: 25.0000 mg | ORAL_TABLET | Freq: Four times a day (QID) | ORAL | Status: DC | PRN

## 2014-02-23 MED ORDER — LOPERAMIDE HCL 2 MG PO CAPS: 2.0000 mg | ORAL_CAPSULE | ORAL | Status: DC | PRN

## 2014-02-23 MED ORDER — DICYCLOMINE HCL 10 MG PO CAPS: 20.0000 mg | ORAL_CAPSULE | Freq: Four times a day (QID) | ORAL | Status: DC | PRN

## 2014-02-23 MED ORDER — IBUPROFEN 400 MG PO TABS
600.0000 mg | ORAL_TABLET | Freq: Three times a day (TID) | ORAL | Status: DC | PRN
  Administered 2014-02-23: 600 mg via ORAL
  Filled 2014-02-23: qty 2

## 2014-02-23 NOTE — Discharge Instructions (Signed)
Follow-up with community mental health resources °

## 2014-02-23 NOTE — ED Notes (Signed)
MD at bedside. 

## 2014-02-23 NOTE — ED Notes (Signed)
Pt requested something for pain and got irritated when ask what was hurting, pt stated" everything hurts, my teeth hurt, I think I have a fever, I'm not seeking drugs".  I ask the sitter to check vitals, temp-98.1.  Dr. Lynelle DoctorKnapp put in prn orders for tylenol and motrin, pt is requesting motrin.

## 2014-02-23 NOTE — ED Notes (Signed)
TTS in progress 
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
## 2014-02-23 NOTE — ED Notes (Signed)
Verified with Behavorial health, patient is ready to be discharged per [place], [place]. Dr. Adriana Simasook notified. IVC papers were receded and notarized per Enid Derryayphne, RN and Agricultural engineerTheresa nursing secretary. Patient alert and oriented x4, denies pain, family at bedside.

## 2014-02-24 LAB — URINE CULTURE

## 2016-03-13 ENCOUNTER — Emergency Department (HOSPITAL_COMMUNITY)
Admission: EM | Admit: 2016-03-13 | Discharge: 2016-03-13 | Disposition: A | Discharge status: Home / Self Care | Payer: Self-pay | Attending: Emergency Medicine | Admitting: Emergency Medicine

## 2016-03-13 ENCOUNTER — Encounter (HOSPITAL_COMMUNITY): Payer: Self-pay | Admitting: Emergency Medicine

## 2016-03-13 ENCOUNTER — Emergency Department (HOSPITAL_COMMUNITY): Payer: Self-pay

## 2016-03-13 DIAGNOSIS — Y939 Activity, unspecified: Secondary | ICD-10-CM | POA: Insufficient documentation

## 2016-03-13 DIAGNOSIS — F17200 Nicotine dependence, unspecified, uncomplicated: Secondary | ICD-10-CM | POA: Insufficient documentation

## 2016-03-13 DIAGNOSIS — Y999 Unspecified external cause status: Secondary | ICD-10-CM | POA: Insufficient documentation

## 2016-03-13 DIAGNOSIS — L309 Dermatitis, unspecified: Secondary | ICD-10-CM | POA: Insufficient documentation

## 2016-03-13 DIAGNOSIS — Z79899 Other long term (current) drug therapy: Secondary | ICD-10-CM | POA: Insufficient documentation

## 2016-03-13 DIAGNOSIS — W1839XA Other fall on same level, initial encounter: Secondary | ICD-10-CM | POA: Insufficient documentation

## 2016-03-13 DIAGNOSIS — S8392XA Sprain of unspecified site of left knee, initial encounter: Secondary | ICD-10-CM | POA: Insufficient documentation

## 2016-03-13 DIAGNOSIS — Y929 Unspecified place or not applicable: Secondary | ICD-10-CM | POA: Insufficient documentation

## 2016-03-13 DIAGNOSIS — S63501A Unspecified sprain of right wrist, initial encounter: Secondary | ICD-10-CM | POA: Insufficient documentation

## 2016-03-13 MED ORDER — BETAMETHASONE DIPROPIONATE 0.05 % EX CREA
TOPICAL_CREAM | Freq: Two times a day (BID) | CUTANEOUS | 0 refills | Status: DC
Start: 2016-03-13 — End: 2022-01-24

## 2016-03-13 MED ORDER — NAPROXEN 500 MG PO TABS: 500.0000 mg | ORAL_TABLET | Freq: Two times a day (BID) | ORAL | 0 refills | Status: DC

## 2016-03-13 NOTE — ED Provider Notes (Signed)
[facility]-EMER[facility]Y DEPT Provider Note   CSN: [id number] Arrival date & time: 03/13/16  1607     History   Chief Complaint Chief Complaint  Patient presents with  . Fall    2 days ago    HPI Shelly George is a 36 y.o. female.  HPI   Shelly George is a 36 y.o. female who presents to the Emergency Department complaining of right wrist pain and left knee pain after a fall that occurred 2 days prior to arrival.  She reports a mechanical fall.  She describes an aching pain to her wrist that is worse with movement.  She also complains of swelling to her wrist and aching knee pain.  She states the knee pain has improved somewhat since the fall.  She denies numbness or swelling of the knee.  She has taken tylenol without relief.  She denies head injury, pain or numbness of the fingers, LOC and dizziness.   Past Medical History:  Diagnosis Date  . Anxiety   . Drug abuse and dependence (HCC)    opiates, suboxone  . Seizure [facility])     Patient Active Problem List   Diagnosis Date Noted  . Polysubstance abuse     History reviewed. No pertinent surgical history.  OB History    Gravida Para Term Preterm AB Living   3 2   1   2    SAB TAB Ectopic Multiple Live Births         1         Home Medications    Prior to Admission medications   Medication Sig Start Date End Date Taking? Authorizing Provider  diphenhydrAMINE (BENADRYL) 25 MG tablet Take 25 mg by mouth every 6 (six) hours as needed for allergies.    Historical Provider, MD  etonogestrel (IMPLANON) 68 MG IMPL implant Inject 1 each into the skin once.    Historical Provider, MD  famotidine (PEPCID) 20 MG tablet Take 1 tablet (20 mg total) by mouth 2 (two) times daily. For 7 days Patient not taking: Reported on 02/22/2014 03/12/13   Pauline Aus, PA-C  Homeopathic Products [facility] ALLERGY RELIEF) GEL Place 1 application into the nose daily as needed (for nasal congestion).    Historical Provider, MD  ibuprofen (ADVIL,MOTRIN) 200  MG tablet Take 200 mg by mouth every 6 (six) hours as needed for mild pain or moderate pain.    Historical Provider, MD  predniSONE (DELTASONE) 20 MG tablet Take 3 tablets po qd x 2 days, then 2 tablets po qd x 2 days, then 1 tablet po qd x 2 days Patient not taking: Reported on 02/22/2014 03/12/13   Pauline Aus, PA-C    Family History Family History  Problem Relation Age of Onset  . Cancer Mother     Social History Social History  Substance Use Topics  . Smoking status: Current Every Day Smoker    Packs/day: 0.50  . Smokeless tobacco: Never Used  . Alcohol use No     Allergies   Codeine and Tegretol [carbamazepine]   Review of Systems Review of Systems  Constitutional: Negative for chills and fever.  Musculoskeletal: Positive for arthralgias (right wrist pain and left knee pain) and joint swelling.  Skin: Negative for color change and wound.  Neurological: Negative for dizziness, weakness and numbness.  All other systems reviewed and are negative.    Physical Exam Updated Vital Signs BP 129/76 (BP Location: Left Arm)   Pulse 101   Temp 98.7  F (37.1 C) (Oral)   Resp 15   Ht 5\' 5"  (1.651 m)   Wt 54.4 kg   LMP 03/13/2016 Comment: period has been on for one month.  SpO2 99%   BMI 19.97 kg/m   Physical Exam  Constitutional: She is oriented to person, place, and time. She appears well-developed and well-nourished. No distress.  HENT:  Head: Normocephalic and atraumatic.  Cardiovascular: Normal rate, regular rhythm and normal heart sounds.   Pulmonary/Chest: Effort normal and breath sounds normal.  Musculoskeletal: She exhibits tenderness. She exhibits no edema.  ttp of distal right wrist.  Mild edema.   Radial pulse is brisk, distal sensation intact.  CR< 2 sec.  No bruising or bony deformity.  Pt has full ROM of the left knee.  No edema.  Neurological: She is alert and oriented to person, place, and time. She exhibits normal muscle tone. Coordination normal.    Skin: Skin is warm and dry.  Erythematous scaly lesions to bilateral dorsal hands.  No edema.    Nursing note and vitals reviewed.     ED Treatments / Results  Labs (all labs ordered are listed, but only abnormal results are displayed) Labs Reviewed - No data to display  EKG  EKG Interpretation None       Radiology Dg Wrist Complete Right  Result Date: 03/13/2016 CLINICAL DATA:  Right lateral wrist pain after a fall 2 nights ago. EXAM: RIGHT WRIST - COMPLETE 3+ VIEW COMPARISON:  None in PACs FINDINGS: The bones of the wrist are subjectively adequately mineralized. There is no acute fracture nor dislocation. The joint spaces are well maintained. The soft tissues exhibit mild diffuse swelling. IMPRESSION: There is no acute or significant chronic bony abnormality of the right wrist. Electronically Signed   By: David  [place] M.D.   On: 03/13/2016 16:54   Dg Knee Complete 4 Views Left  Result Date: 03/13/2016 CLINICAL DATA:  Status post fall 2 nights ago with persistent left medial knee pain. EXAM: LEFT KNEE - COMPLETE 4+ VIEW COMPARISON:  None in PACs FINDINGS: The bones of the left knee are subjectively adequately mineralized. There is no acute fracture nor dislocation. The joint spaces are well maintained. There is no joint effusion. IMPRESSION: There is no acute bony abnormality of the left knee. Electronically Signed   By: David  [place] M.D.   On: 03/13/2016 16:56    Procedures Procedures (including critical care time)  Medications Ordered in ED Medications - No data to display   Initial Impression / Assessment and Plan / ED Course  I have reviewed the triage vital signs and the nursing notes.  Pertinent labs & imaging results that were available during my care of the patient were reviewed by me and considered in my medical decision making (see chart for details).     Patient reviewed on [facility] no narcotic prescriptions or benzos on file.  Patient has ambulated in the  department and to the bathroom several times, gait is steady, no focal neuro deficits on exam.  likely sprain to the wrist, NV intact.    Recurrent eczema bilateral hands.  Pt requesting steroid cream   Wrist splint applied.  Pt agrees to orthopedic f/u in one week if not improving.    Final Clinical Impressions(s) / ED Diagnoses   Final diagnoses:  Sprain of right wrist, initial encounter  Sprain of left knee, unspecified ligament, initial encounter  Eczema, unspecified type    New Prescriptions New Prescriptions   No medications  on file     Pauline Ausammy Ion Gonnella, Cordelia Poche-C 03/16/16 1707    Marily MemosJason Mesner, MD 03/17/16 [phone]

## 2016-03-13 NOTE — ED Triage Notes (Signed)
Fell 2 days, injury to right wrist (8/10) and left knee (8/10).  Denies dizziness or hitting head.

## 2016-03-13 NOTE — Discharge Instructions (Signed)
Apply cream as directed.  Wear the splint as needed for comfort for at least one week.  Follow-up with Dr. Romeo AppleHArrison if not improving

## 2016-07-01 ENCOUNTER — Emergency Department (HOSPITAL_COMMUNITY)
Admission: EM | Admit: 2016-07-01 | Discharge: 2016-07-01 | Disposition: A | Discharge status: Home / Self Care | Payer: Self-pay

## 2016-07-01 NOTE — ED Triage Notes (Signed)
Name called several times for triage.  No answer.

## 2016-07-01 NOTE — ED Notes (Signed)
No answer on 3rd call. 

## 2016-07-01 NOTE — ED Triage Notes (Signed)
Called for triage x2. Pt not in waiting room.

## 2016-07-01 NOTE — ED Triage Notes (Signed)
Called for triage x1- not in waiting room

## 2017-02-07 ENCOUNTER — Emergency Department (HOSPITAL_COMMUNITY)
Admission: EM | Admit: 2017-02-07 | Discharge: 2017-02-07 | Disposition: A | Discharge status: Home / Self Care | Payer: Self-pay

## 2017-02-07 NOTE — ED Notes (Signed)
Not in WR when called 

## 2017-02-07 NOTE — ED Notes (Signed)
No response when called.

## 2017-08-25 IMAGING — DX DG KNEE COMPLETE 4+V*L*
4 series · 4 of 4 positions shown · non-contrast
Comparison: None in PACs

CLINICAL DATA: Status post fall 2 nights ago with persistent left
medial knee pain.

EXAM:
LEFT KNEE - COMPLETE 4+ VIEW

[knee ap]
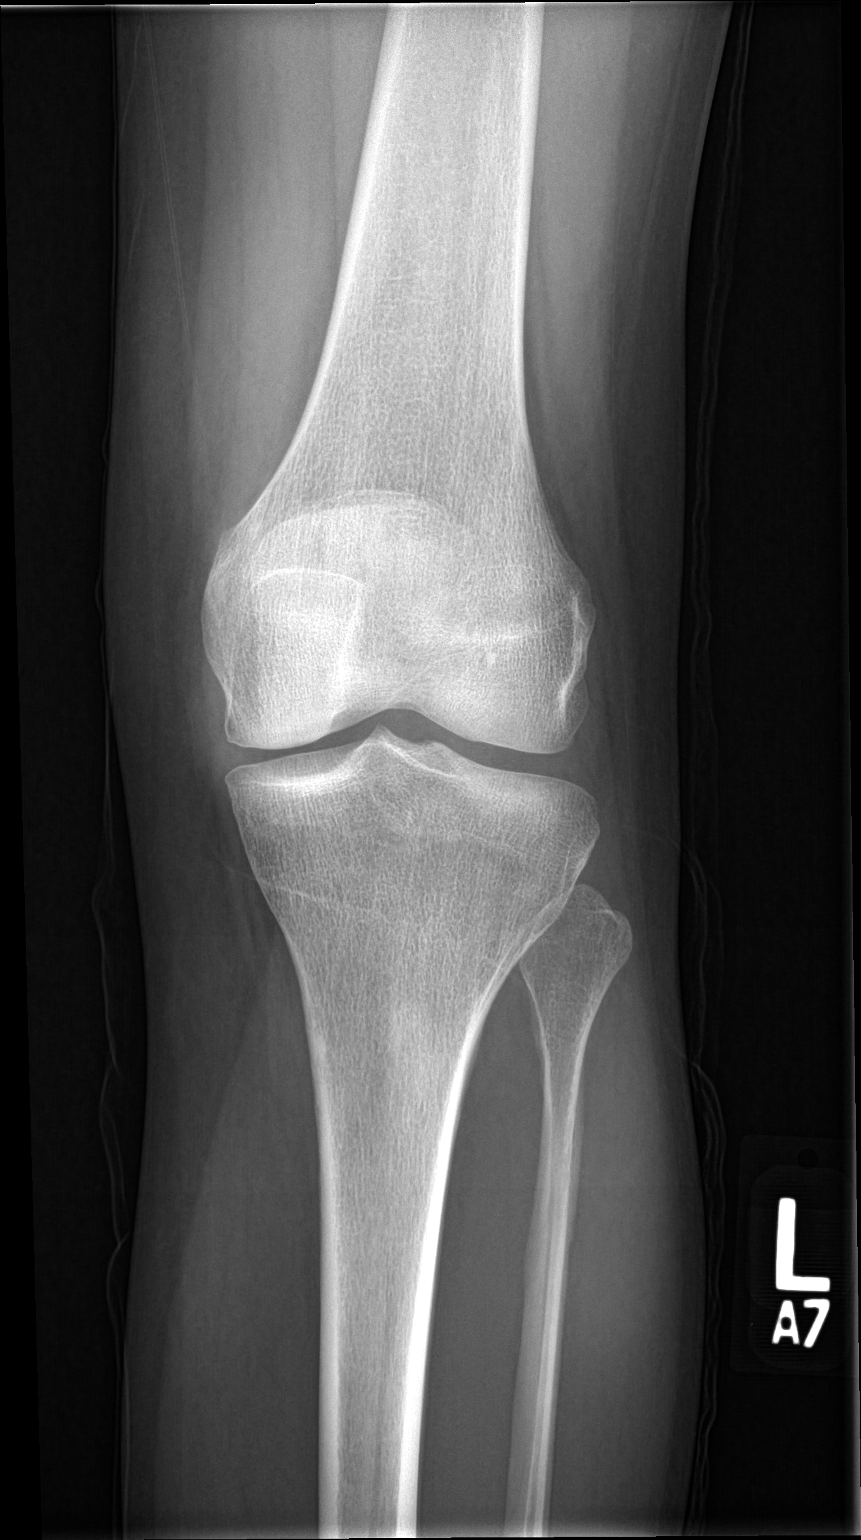

[knee obl (1 of 2)]
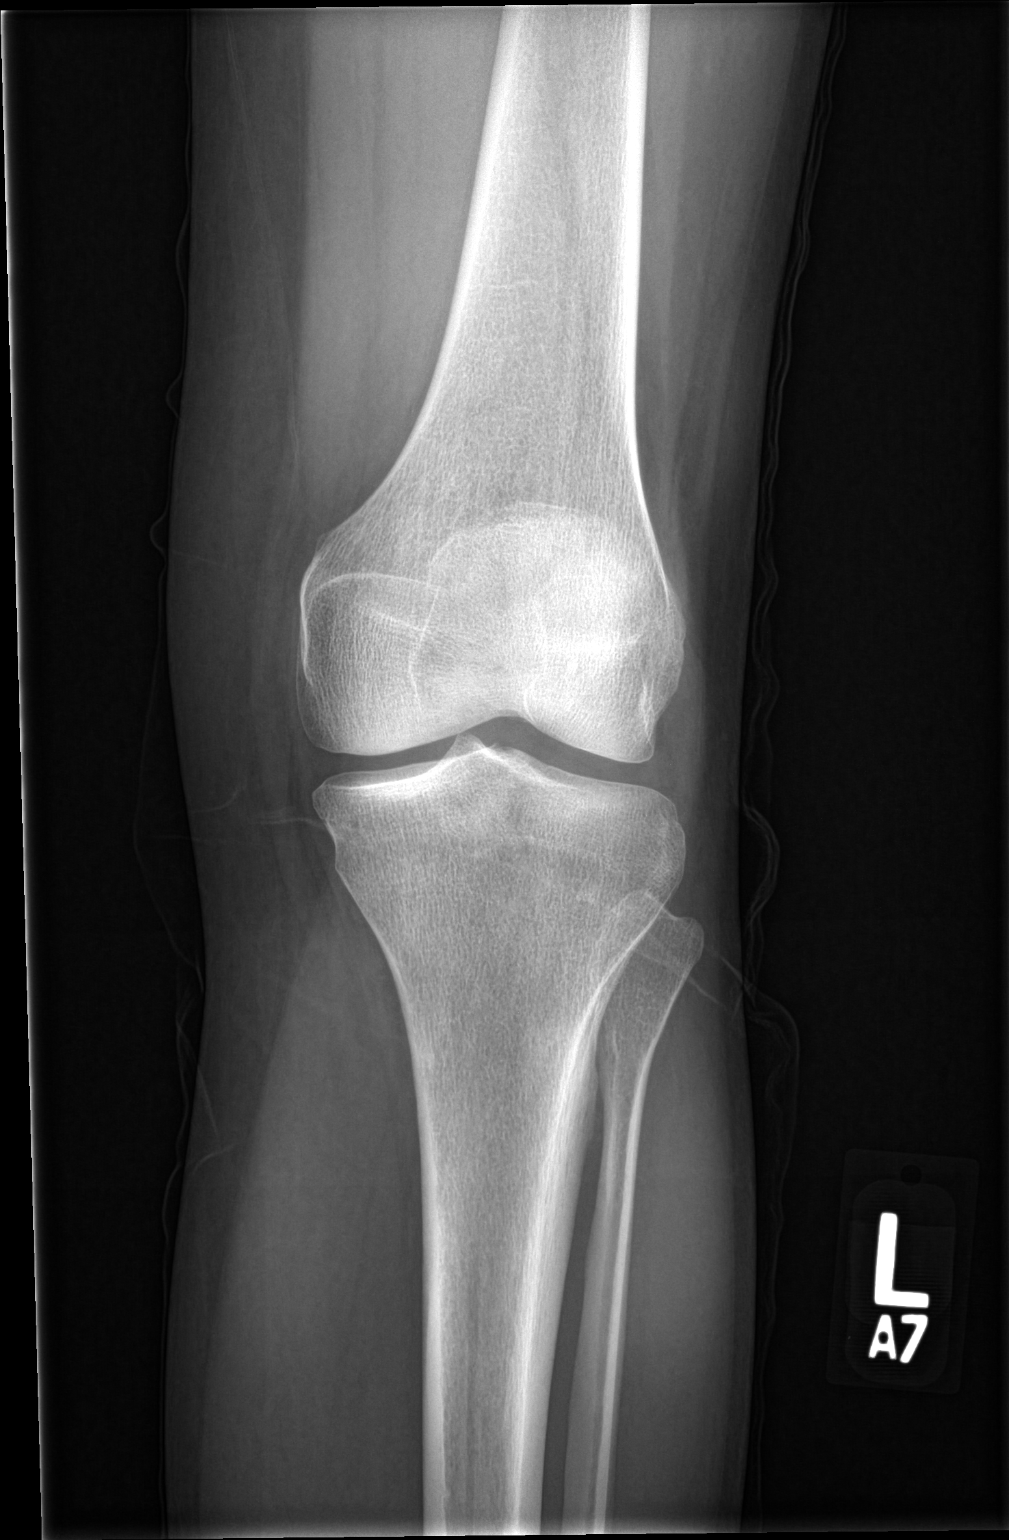

[knee obl (2 of 2)]
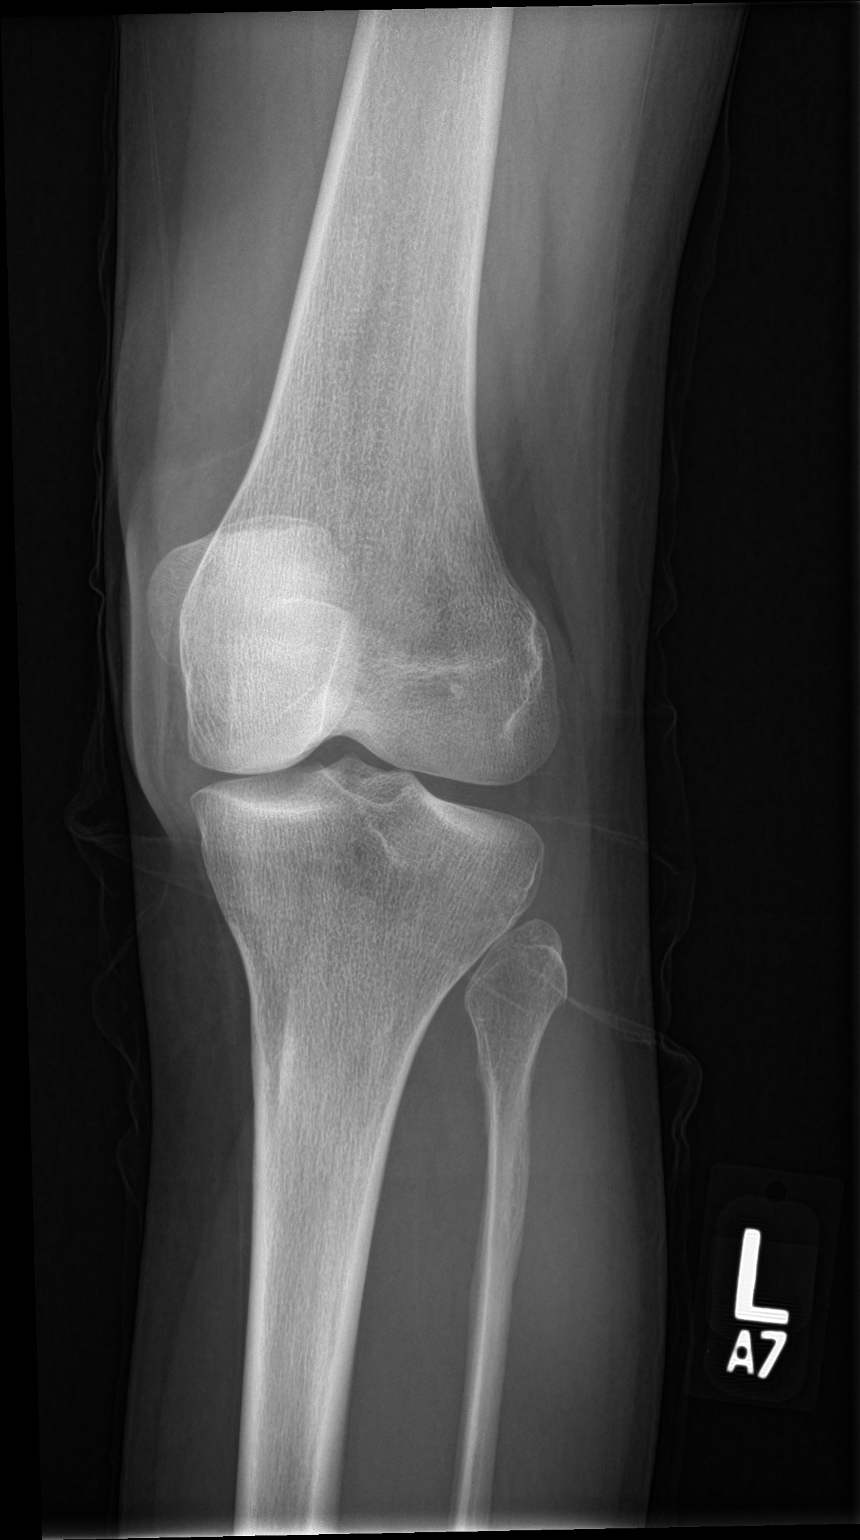

[knee lat]
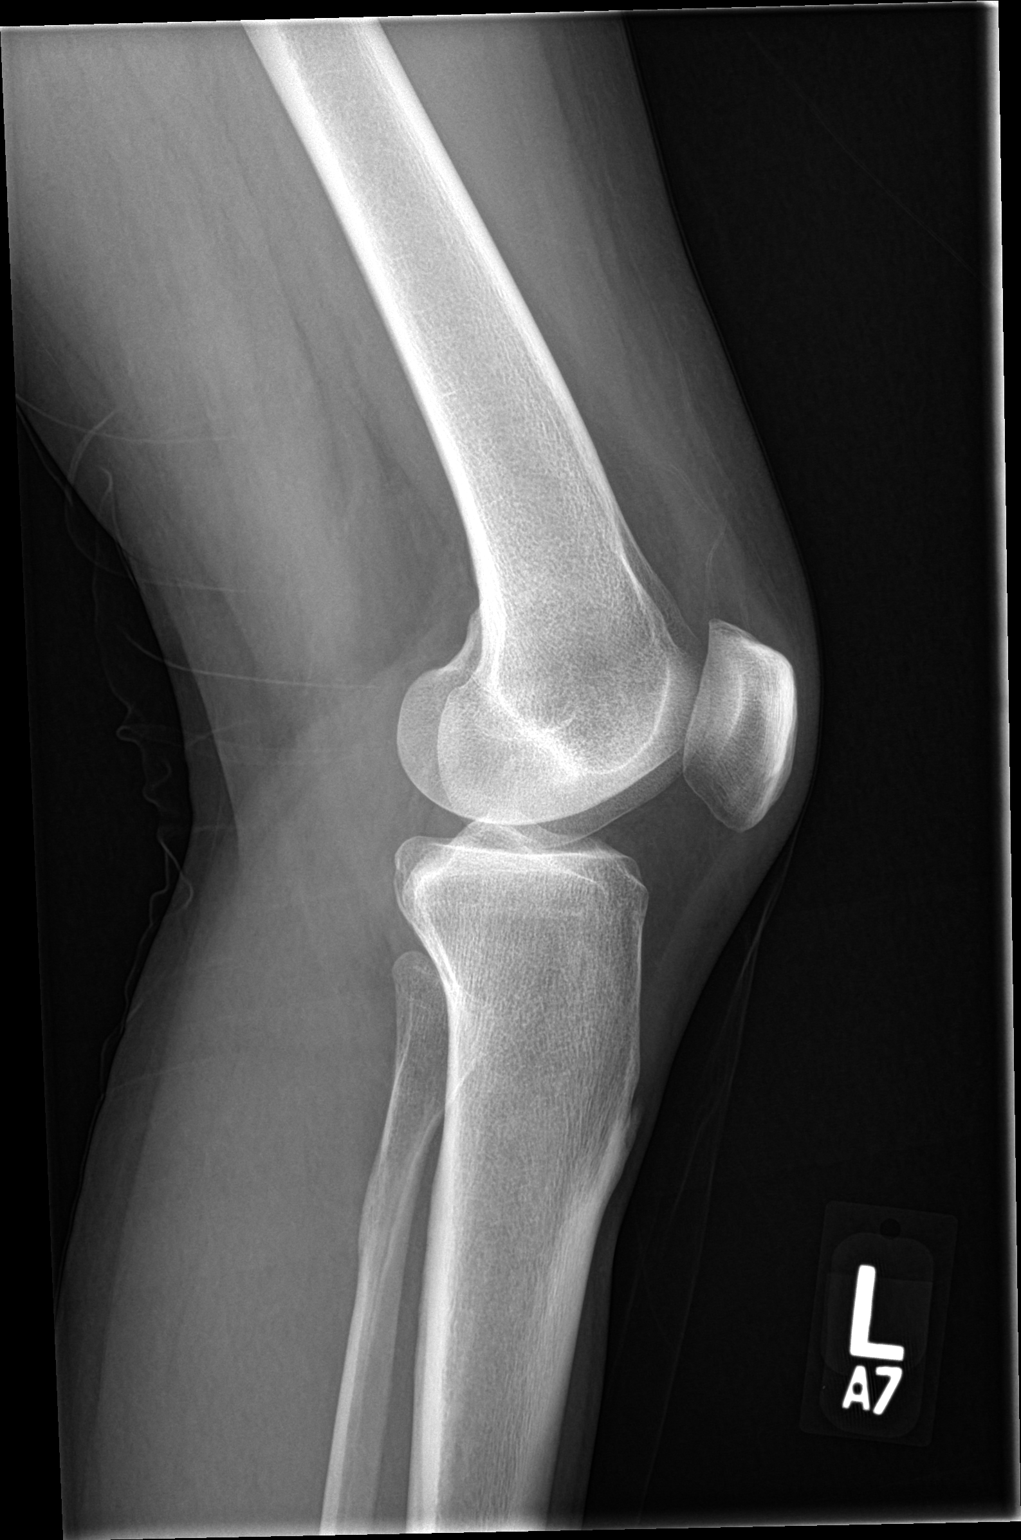

[4 of 4 positions shown; findings below may reference images not displayed]

FINDINGS: The bones of the left knee are subjectively adequately mineralized.
There is no acute fracture nor dislocation. The joint spaces are
well maintained. There is no joint effusion.
IMPRESSION: There is no acute bony abnormality of the left knee.

## 2017-10-09 ENCOUNTER — Other Ambulatory Visit: Payer: Self-pay

## 2017-10-09 ENCOUNTER — Emergency Department (HOSPITAL_COMMUNITY)
Admission: EM | Admit: 2017-10-09 | Discharge: 2017-10-09 | Disposition: A | Discharge status: Home / Self Care | Payer: Self-pay | Attending: Emergency Medicine | Admitting: Emergency Medicine

## 2017-10-09 ENCOUNTER — Encounter (HOSPITAL_COMMUNITY): Payer: Self-pay | Admitting: Emergency Medicine

## 2017-10-09 DIAGNOSIS — N3000 Acute cystitis without hematuria: Secondary | ICD-10-CM | POA: Insufficient documentation

## 2017-10-09 DIAGNOSIS — Z79899 Other long term (current) drug therapy: Secondary | ICD-10-CM | POA: Insufficient documentation

## 2017-10-09 DIAGNOSIS — F17200 Nicotine dependence, unspecified, uncomplicated: Secondary | ICD-10-CM | POA: Insufficient documentation

## 2017-10-09 LAB — URINALYSIS, ROUTINE W REFLEX MICROSCOPIC
Bilirubin Urine: NEGATIVE
Glucose, UA: NEGATIVE mg/dL
HGB URINE DIPSTICK: NEGATIVE
KETONES UR: NEGATIVE mg/dL
Nitrite: POSITIVE — AB
Protein, ur: 30 mg/dL — AB
RBC / HPF: >50 RBC/hpf — ABNORMAL HIGH (ref 0–5)
Specific Gravity, Urine: 1.019 (ref 1.005–1.030)
WBC, UA: >50 WBC/hpf — ABNORMAL HIGH (ref 0–5)
pH: 7 (ref 5.0–8.0)

## 2017-10-09 LAB — PREGNANCY, URINE: PREG TEST UR: NEGATIVE

## 2017-10-09 LAB — POC URINE PREG, ED: Preg Test, Ur: NEGATIVE

## 2017-10-09 MED ORDER — CEPHALEXIN 500 MG PO CAPS
500.0000 mg | ORAL_CAPSULE | Freq: Once | ORAL | Status: AC
  Administered 2017-10-09: 500 mg via ORAL
  Filled 2017-10-09: qty 1

## 2017-10-09 MED ORDER — CEPHALEXIN 500 MG PO CAPS: 500.0000 mg | ORAL_CAPSULE | Freq: Three times a day (TID) | ORAL | 0 refills | Status: DC

## 2017-10-09 MED ORDER — PHENAZOPYRIDINE HCL 100 MG PO TABS
100.0000 mg | ORAL_TABLET | Freq: Once | ORAL | Status: AC
  Administered 2017-10-09: 100 mg via ORAL
  Filled 2017-10-09: qty 1

## 2017-10-09 MED ORDER — KETOROLAC TROMETHAMINE 30 MG/ML IJ SOLN
30.0000 mg | Freq: Once | INTRAMUSCULAR | Status: AC
  Administered 2017-10-09: 30 mg via INTRAMUSCULAR
  Filled 2017-10-09: qty 1

## 2017-10-09 NOTE — ED Provider Notes (Signed)
[facility] EMERGENCY DEPARTMENT Provider Note   CSN: [id number] Arrival date & time: 10/09/17  0147     History   Chief Complaint Chief Complaint  Patient presents with  . Dysuria    HPI Shelly George is a 37 y.o. female.  HPI  This is a 37 year old female who presents with dysuria and back pain.  She reports 1 day history of dysuria.  She reports bilateral lower back pain.  No fevers.  Denies vaginal discharge or concerns for STDs.  She does not think she is pregnant.  She has a history of urinary tract infection in the past with similar symptoms.  Denies any abdominal pain, nausea, vomiting, diarrhea.  Rates her discomfort at 8 out of 10.  She took Azo and Tylenol with minimal relief.  Past Medical History:  Diagnosis Date  . Anxiety   . Drug abuse and dependence (HCC)    opiates, suboxone  . Seizure [facility])     Patient Active Problem List   Diagnosis Date Noted  . Polysubstance abuse (HCC)     History reviewed. No pertinent surgical history.   OB History    Gravida  3   Para  2   Term      Preterm  1   AB      Living  2     SAB      TAB      Ectopic      Multiple  1   Live Births               Home Medications    Prior to Admission medications   Medication Sig Start Date End Date Taking? Authorizing Provider  betamethasone dipropionate (DIPROLENE) 0.05 % cream Apply topically 2 (two) times daily. 03/13/16   Triplett, Tammy, PA-C  cephALEXin (KEFLEX) 500 MG capsule Take 1 capsule (500 mg total) by mouth 3 (three) times daily. 10/09/17   Jaeleen Inzunza, Mayer Masker, MD  diphenhydrAMINE (BENADRYL) 25 MG tablet Take 25 mg by mouth every 6 (six) hours as needed for allergies.    [provider]  etonogestrel (IMPLANON) 68 MG IMPL implant Inject 1 each into the skin once.    [provider]  famotidine (PEPCID) 20 MG tablet Take 1 tablet (20 mg total) by mouth 2 (two) times daily. For 7 days Patient not taking: Reported on 02/22/2014  03/12/13   Pauline Aus, PA-C  Homeopathic Products [facility] ALLERGY RELIEF) GEL Place 1 application into the nose daily as needed (for nasal congestion).    [provider]  ibuprofen (ADVIL,MOTRIN) 200 MG tablet Take 200 mg by mouth every 6 (six) hours as needed for mild pain or moderate pain.    [provider]  naproxen (NAPROSYN) 500 MG tablet Take 1 tablet (500 mg total) by mouth 2 (two) times daily with a meal. 03/13/16   Triplett, Tammy, PA-C  predniSONE (DELTASONE) 20 MG tablet Take 3 tablets po qd x 2 days, then 2 tablets po qd x 2 days, then 1 tablet po qd x 2 days Patient not taking: Reported on 02/22/2014 03/12/13   Pauline Aus, PA-C    Family History Family History  Problem Relation Age of Onset  . Cancer Mother     Social History Social History   Tobacco Use  . Smoking status: Current Every Day Smoker    Packs/day: 0.50  . Smokeless tobacco: Never Used  Substance Use Topics  . Alcohol use: No  . Drug use: Yes  Types: Oxycodone    Comment: suboxene-street drug used 5 days ago     Allergies   Codeine and Tegretol [carbamazepine]   Review of Systems Review of Systems  Constitutional: Negative for fever.  Respiratory: Negative for shortness of breath.   Cardiovascular: Negative for chest pain.  Gastrointestinal: Negative for abdominal pain, nausea and vomiting.  Genitourinary: Positive for dysuria. Negative for vaginal discharge.  Musculoskeletal: Positive for back pain.  All other systems reviewed and are negative.    Physical Exam Updated Vital Signs There were no vitals taken for this visit.  Physical Exam  Constitutional: She is oriented to person, place, and time. She appears well-developed and well-nourished.  Nontoxic-appearing  HENT:  Head: Normocephalic and atraumatic.  Neck: Neck supple.  Cardiovascular: Normal rate, regular rhythm and normal heart sounds.  Pulmonary/Chest: Effort normal. No respiratory distress. She has  no wheezes.  Abdominal: Soft. Bowel sounds are normal. There is no tenderness. There is no guarding.  Genitourinary:  Genitourinary Comments: No CVA tenderness  Neurological: She is alert and oriented to person, place, and time.  Skin: Skin is warm and dry.  Psychiatric: She has a normal mood and affect.  Nursing note and vitals reviewed.    ED Treatments / Results  Labs (all labs ordered are listed, but only abnormal results are displayed) Labs Reviewed  URINALYSIS, ROUTINE W REFLEX MICROSCOPIC - Abnormal; Notable for the following components:      Result Value   Color, Urine AMBER (*)    APPearance HAZY (*)    Protein, ur 30 (*)    Nitrite POSITIVE (*)    Leukocytes, UA TRACE (*)    RBC / HPF >50 (*)    WBC, UA >50 (*)    Bacteria, UA RARE (*)    Non Squamous Epithelial 6-10 (*)    All other components within normal limits  URINE CULTURE  PREGNANCY, URINE    EKG None  Radiology No results found.  Procedures Procedures (including critical care time)  Medications Ordered in ED Medications  ketorolac (TORADOL) 30 MG/ML injection 30 mg (30 mg Intramuscular Given 10/09/17 0250)  phenazopyridine (PYRIDIUM) tablet 100 mg (100 mg Oral Given 10/09/17 0249)  cephALEXin (KEFLEX) capsule 500 mg (500 mg Oral Given 10/09/17 0253)     Initial Impression / Assessment and Plan / ED Course  I have reviewed the triage vital signs and the nursing notes.  Pertinent labs & imaging results that were available during my care of the patient were reviewed by me and considered in my medical decision making (see chart for details).     Patient presents with dysuria.  Also reports bilateral back pain.  No lateralizing back pain.  No CVA tenderness on exam.  Vital signs taken during downtime reviewed and reassuring.  Patient is afebrile.  Suspect acute cystitis.  Patient given Toradol and Pyridium.  Urinalysis is nitrite positive with greater than 50 white cells and bacteria.  Urine culture  sent.  Point-of-care urinalysis pregnancy test is negative.  Will discharge patient on Keflex.  Low suspicion at this time for pyelonephritis.  She was given return precautions.  After history, exam, and medical workup I feel the patient has been appropriately medically screened and is safe for discharge home. Pertinent diagnoses were discussed with the patient. Patient was given return precautions.   Final Clinical Impressions(s) / ED Diagnoses   Final diagnoses:  Acute cystitis without hematuria    ED Discharge Orders  Ordered    cephALEXin (KEFLEX) 500 MG capsule  3 times daily     10/09/17 0258           Shon Baton, MD 10/09/17 [phone]

## 2017-10-09 NOTE — ED Notes (Signed)
POC urine Pregnancy negative

## 2017-10-09 NOTE — ED Triage Notes (Signed)
Pt C/O dysuria and bilateral lower back pain that began yesterday morning.

## 2017-10-09 NOTE — Discharge Instructions (Addendum)
You were seen today for urinary tract symptoms.  You do have evidence of UTI.  Take antibiotics as prescribed.  If you develop fever or worsening back pain, you should be reevaluated immediately.

## 2017-10-11 LAB — URINE CULTURE

## 2017-10-12 ENCOUNTER — Telehealth: Payer: Self-pay

## 2017-10-12 NOTE — Telephone Encounter (Signed)
Post ED Visit - Positive Culture Follow-up  Culture report reviewed by antimicrobial stewardship pharmacist:  []  Enzo BiNathan Batchelder, Pharm.D. []  Celedonio MiyamotoJeremy Frens, Pharm.D., BCPS AQ-ID []  Garvin FilaMike Maccia, Pharm.D., BCPS []  Georgina PillionElizabeth Martin, Pharm.D., BCPS []  FalconMinh Pham, VermontPharm.D., BCPS, AAHIVP []  Estella HuskMichelle Turner, Pharm.D., BCPS, AAHIVP []  Lysle Pearlachel Rumbarger, PharmD, BCPS []  Phillips Climeshuy Dang, PharmD, BCPS []  Agapito GamesAlison Masters, PharmD, BCPS [x]  Verlan FriendsErin Deja, PharmD  Positive urine culture Treated with Cephalexin, organism sensitive to the same and no further patient follow-up is required at this time.  Jerry CarasCullom, Oren Barella Burnett 10/12/2017, 9:41 AM

## 2018-12-31 ENCOUNTER — Encounter: Payer: Self-pay | Admitting: Emergency Medicine

## 2018-12-31 ENCOUNTER — Emergency Department: Payer: Self-pay

## 2018-12-31 ENCOUNTER — Other Ambulatory Visit: Payer: Self-pay

## 2018-12-31 ENCOUNTER — Emergency Department
Admission: EM | Admit: 2018-12-31 | Discharge: 2018-12-31 | Disposition: A | Discharge status: Home / Self Care | Payer: Self-pay | Attending: Emergency Medicine | Admitting: Emergency Medicine

## 2018-12-31 DIAGNOSIS — R600 Localized edema: Secondary | ICD-10-CM | POA: Insufficient documentation

## 2018-12-31 DIAGNOSIS — M25473 Effusion, unspecified ankle: Secondary | ICD-10-CM

## 2018-12-31 DIAGNOSIS — M25471 Effusion, right ankle: Secondary | ICD-10-CM | POA: Insufficient documentation

## 2018-12-31 DIAGNOSIS — I10 Essential (primary) hypertension: Secondary | ICD-10-CM | POA: Insufficient documentation

## 2018-12-31 DIAGNOSIS — R0602 Shortness of breath: Secondary | ICD-10-CM | POA: Insufficient documentation

## 2018-12-31 DIAGNOSIS — F17200 Nicotine dependence, unspecified, uncomplicated: Secondary | ICD-10-CM | POA: Insufficient documentation

## 2018-12-31 DIAGNOSIS — Z79899 Other long term (current) drug therapy: Secondary | ICD-10-CM | POA: Insufficient documentation

## 2018-12-31 LAB — CBC
HCT: 40.6 % (ref 36.0–46.0)
Hemoglobin: 13.6 g/dL (ref 12.0–15.0)
MCH: 30.6 pg (ref 26.0–34.0)
MCHC: 33.5 g/dL (ref 30.0–36.0)
MCV: 91.2 fL (ref 80.0–100.0)
Platelets: 202 10*3/uL (ref 150–400)
RBC: 4.45 MIL/uL (ref 3.87–5.11)
RDW: 12.8 % (ref 11.5–15.5)
WBC: 6.9 10*3/uL (ref 4.0–10.5)
nRBC: 0 % (ref 0.0–0.2)

## 2018-12-31 LAB — COMPREHENSIVE METABOLIC PANEL
ALT: 28 U/L (ref 0–44)
AST: 24 U/L (ref 15–41)
Albumin: 3.5 g/dL (ref 3.5–5.0)
Alkaline Phosphatase: 78 U/L (ref 38–126)
Anion gap: 9 (ref 5–15)
BUN: 12 mg/dL (ref 6–20)
CO2: 24 mmol/L (ref 22–32)
Calcium: 9.2 mg/dL (ref 8.9–10.3)
Chloride: 106 mmol/L (ref 98–111)
Creatinine, Ser: 0.74 mg/dL (ref 0.44–1.00)
GFR calc Af Amer: >60 mL/min (ref >60)
GFR calc non Af Amer: >60 mL/min (ref >60)
Glucose, Bld: 94 mg/dL (ref 70–99)
Potassium: 3.9 mmol/L (ref 3.5–5.1)
Sodium: 139 mmol/L (ref 135–145)
Total Bilirubin: 0.3 mg/dL (ref 0.3–1.2)
Total Protein: 6.6 g/dL (ref 6.5–8.1)

## 2018-12-31 LAB — TROPONIN I (HIGH SENSITIVITY): Troponin I (High Sensitivity): <2 ng/L (ref <18)

## 2018-12-31 LAB — POCT PREGNANCY, URINE: Preg Test, Ur: NEGATIVE

## 2018-12-31 MED ORDER — SODIUM CHLORIDE 0.9% FLUSH: 3.0000 mL | Freq: Once | INTRAVENOUS | Status: DC

## 2018-12-31 MED ORDER — HYDROCHLOROTHIAZIDE 25 MG PO TABS: 25.0000 mg | ORAL_TABLET | Freq: Every day | ORAL | 0 refills | Status: DC

## 2018-12-31 NOTE — ED Provider Notes (Signed)
[facility] Emergency Department Provider Note   ____________________________________________    I have reviewed the triage vital signs and the nursing notes.   HISTORY  Chief Complaint Ankle swelling, shortness of breath    HPI Shelly George is a 38 y.o. female with a history of drug abuse, anxiety presents today with complaints of mild swelling in her ankles, hands.  She also reports that she felt mildly short of breath yesterday however feels better today.  Denies fevers or chills.  No chest pain.  No nausea or vomiting.  No abdominal pain.  Is not take anything for this.  Reports this is happened to her in the past and she has needed a fluid pill.  Does admit to social alcohol use  Past Medical History:  Diagnosis Date  . Anxiety   . Drug abuse and dependence (HCC)    opiates, suboxone  . Seizure [facility])     Patient Active Problem List   Diagnosis Date Noted  . Polysubstance abuse ([place])     History reviewed. No pertinent surgical history.  Prior to Admission medications   Medication Sig Start Date End Date Taking? Authorizing Provider  betamethasone dipropionate (DIPROLENE) 0.05 % cream Apply topically 2 (two) times daily. 03/13/16   Triplett, Tammy, PA-C  cephALEXin (KEFLEX) 500 MG capsule Take 1 capsule (500 mg total) by mouth 3 (three) times daily. 10/09/17   Horton, Barbette Hair, MD  diphenhydrAMINE (BENADRYL) 25 MG tablet Take 25 mg by mouth every 6 (six) hours as needed for allergies.    [provider]  etonogestrel (IMPLANON) 68 MG IMPL implant Inject 1 each into the skin once.    [provider]  famotidine (PEPCID) 20 MG tablet Take 1 tablet (20 mg total) by mouth 2 (two) times daily. For 7 days Patient not taking: Reported on 02/22/2014 03/12/13   Kem Parkinson, PA-C  Homeopathic Products [facility] ALLERGY RELIEF) GEL Place 1 application into the nose daily as needed (for nasal congestion).    [provider]   hydrochlorothiazide (HYDRODIURIL) 25 MG tablet Take 1 tablet (25 mg total) by mouth daily. 12/31/18   Lavonia Drafts, MD  ibuprofen (ADVIL,MOTRIN) 200 MG tablet Take 200 mg by mouth every 6 (six) hours as needed for mild pain or moderate pain.    [provider]  naproxen (NAPROSYN) 500 MG tablet Take 1 tablet (500 mg total) by mouth 2 (two) times daily with a meal. 03/13/16   Triplett, Tammy, PA-C  predniSONE (DELTASONE) 20 MG tablet Take 3 tablets po qd x 2 days, then 2 tablets po qd x 2 days, then 1 tablet po qd x 2 days Patient not taking: Reported on 02/22/2014 03/12/13   Kem Parkinson, PA-C     Allergies Codeine and Tegretol [carbamazepine]  Family History  Problem Relation Age of Onset  . Cancer Mother     Social History Social History   Tobacco Use  . Smoking status: Current Every Day Smoker    Packs/day: 0.50  . Smokeless tobacco: Never Used  Substance Use Topics  . Alcohol use: Yes  . Drug use: Yes    Types: Oxycodone    Comment: suboxene-street drug used 5 days ago    Review of Systems  Constitutional: No fever/chills Eyes: No visual changes.  ENT: No sore throat. Cardiovascular: As above Respiratory: No cough Gastrointestinal: No abdominal pain.   Genitourinary: Negative for dysuria. Musculoskeletal: As above Skin: Negative for rash. Neurological: Negative for headaches or  weakness   ____________________________________________   PHYSICAL EXAM:  VITAL SIGNS: ED Triage Vitals  Enc Vitals Group     BP 12/31/18 1100 (!) 186/95     Pulse Rate 12/31/18 1100 95     Resp 12/31/18 1100 16     Temp 12/31/18 1100 98.8 F (37.1 C)     Temp src --      SpO2 12/31/18 1100 98 %     Weight 12/31/18 1057 62.6 kg (138 lb)     Height 12/31/18 1057 1.6 m (5\' 3" )     Head Circumference --      Peak Flow --      Pain Score 12/31/18 1057 7     Pain Loc --      Pain Edu? --      Excl. in GC? --     Constitutional: Alert and oriented.e Eyes:  Conjunctivae are normal.   Mouth/Throat: Mucous membranes are moist.    Cardiovascular: Normal rate, regular rhythm. Grossly normal heart sounds.  Good peripheral circulation. Respiratory: Normal respiratory effort.  No retractions. Lungs CTAB. Gastrointestinal: Soft and nontender. No distention.    Musculoskeletal: Minimal swelling to the ankles bilaterally, no discernible hand edema, no painful range of motion, no joint swelling Neurologic:  Normal speech and language. No gross focal neurologic deficits are appreciated.  Skin:  Skin is warm, dry and intact. No rash noted. Psychiatric: Mood and affect are normal. Speech and behavior are normal.  ____________________________________________   LABS (all labs ordered are listed, but only abnormal results are displayed)  Labs Reviewed  CBC  COMPREHENSIVE METABOLIC PANEL  POCT PREGNANCY, URINE  POC URINE PREG, ED  TROPONIN I (HIGH SENSITIVITY)   ____________________________________________  EKG  ED ECG REPORT I, 13/12/20, the attending physician, personally viewed and interpreted this ECG.  Date: 12/31/2018  Rhythm: normal sinus rhythm QRS Axis: normal Intervals: normal ST/T Wave abnormalities: normal Narrative Interpretation: no evidence of acute ischemia  ____________________________________________  RADIOLOGY  Chest x-ray unremarkable ____________________________________________   PROCEDURES  Procedure(s) performed: No  Procedures   Critical Care performed: No ____________________________________________   INITIAL IMPRESSION / ASSESSMENT AND PLAN / ED COURSE  Pertinent labs & imaging results that were available during my care of the patient were reviewed by me and considered in my medical decision making (see chart for details).  Patient overall quite well-appearing in no acute distress, vital signs notable for elevated blood pressure, review of records demonstrates mildly elevated blood pressure on  only other visit in the last year.  No redness or swelling of the joints, no chest pain.  No shortness of breath today.  Reassuring lung exam.  Chest x-ray labs unremarkable.  We will start her on hydrochlorothiazide for her blood pressure a and it also may be helpful with her swelling of unknown origin.  I will have her follow-up closely as an outpatient for further evaluation.    ____________________________________________   FINAL CLINICAL IMPRESSION(S) / ED DIAGNOSES  Final diagnoses:  Essential hypertension  Ankle edema        Note:  This document was prepared using Dragon voice recognition software and may include unintentional dictation errors.   13/01/2019, MD 12/31/18 1438

## 2018-12-31 NOTE — ED Triage Notes (Signed)
Apartment manager [place] - [phone]  Catha Gosselin [phone]

## 2018-12-31 NOTE — ED Notes (Signed)
Pt drinks 40 oz beer every other day.  Swelling noted to all extremities.  Bmp changed to cmp

## 2018-12-31 NOTE — ED Triage Notes (Signed)
Says since yesterday swelling hands and all over and short of breath.

## 2020-06-13 IMAGING — CR DG CHEST 2V
2 series · 2 of 2 positions shown · non-contrast
Comparison: 04/25/2007

CLINICAL DATA: Shortness of breath

EXAM:
CHEST - 2 VIEW

[chest pa]
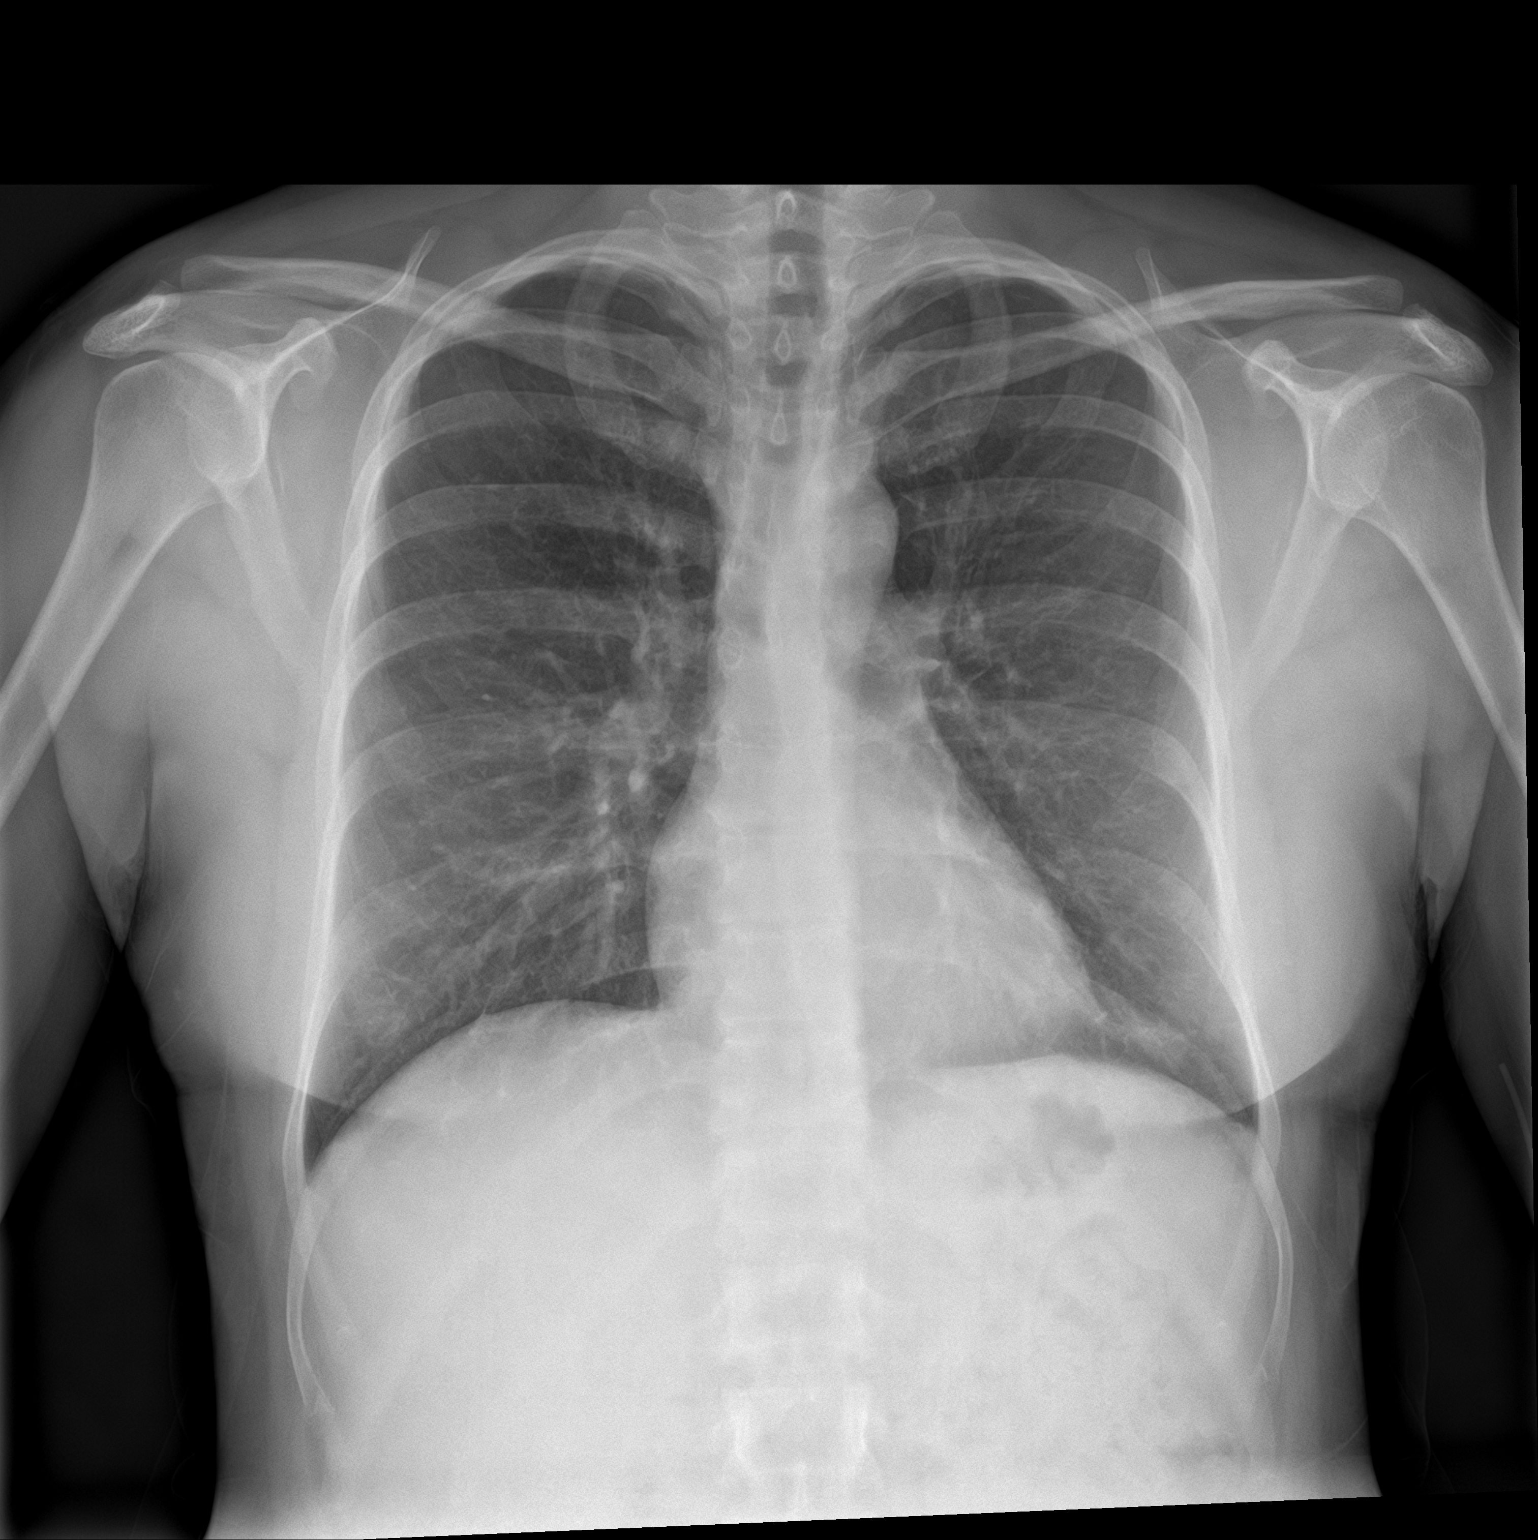

[chest lat]
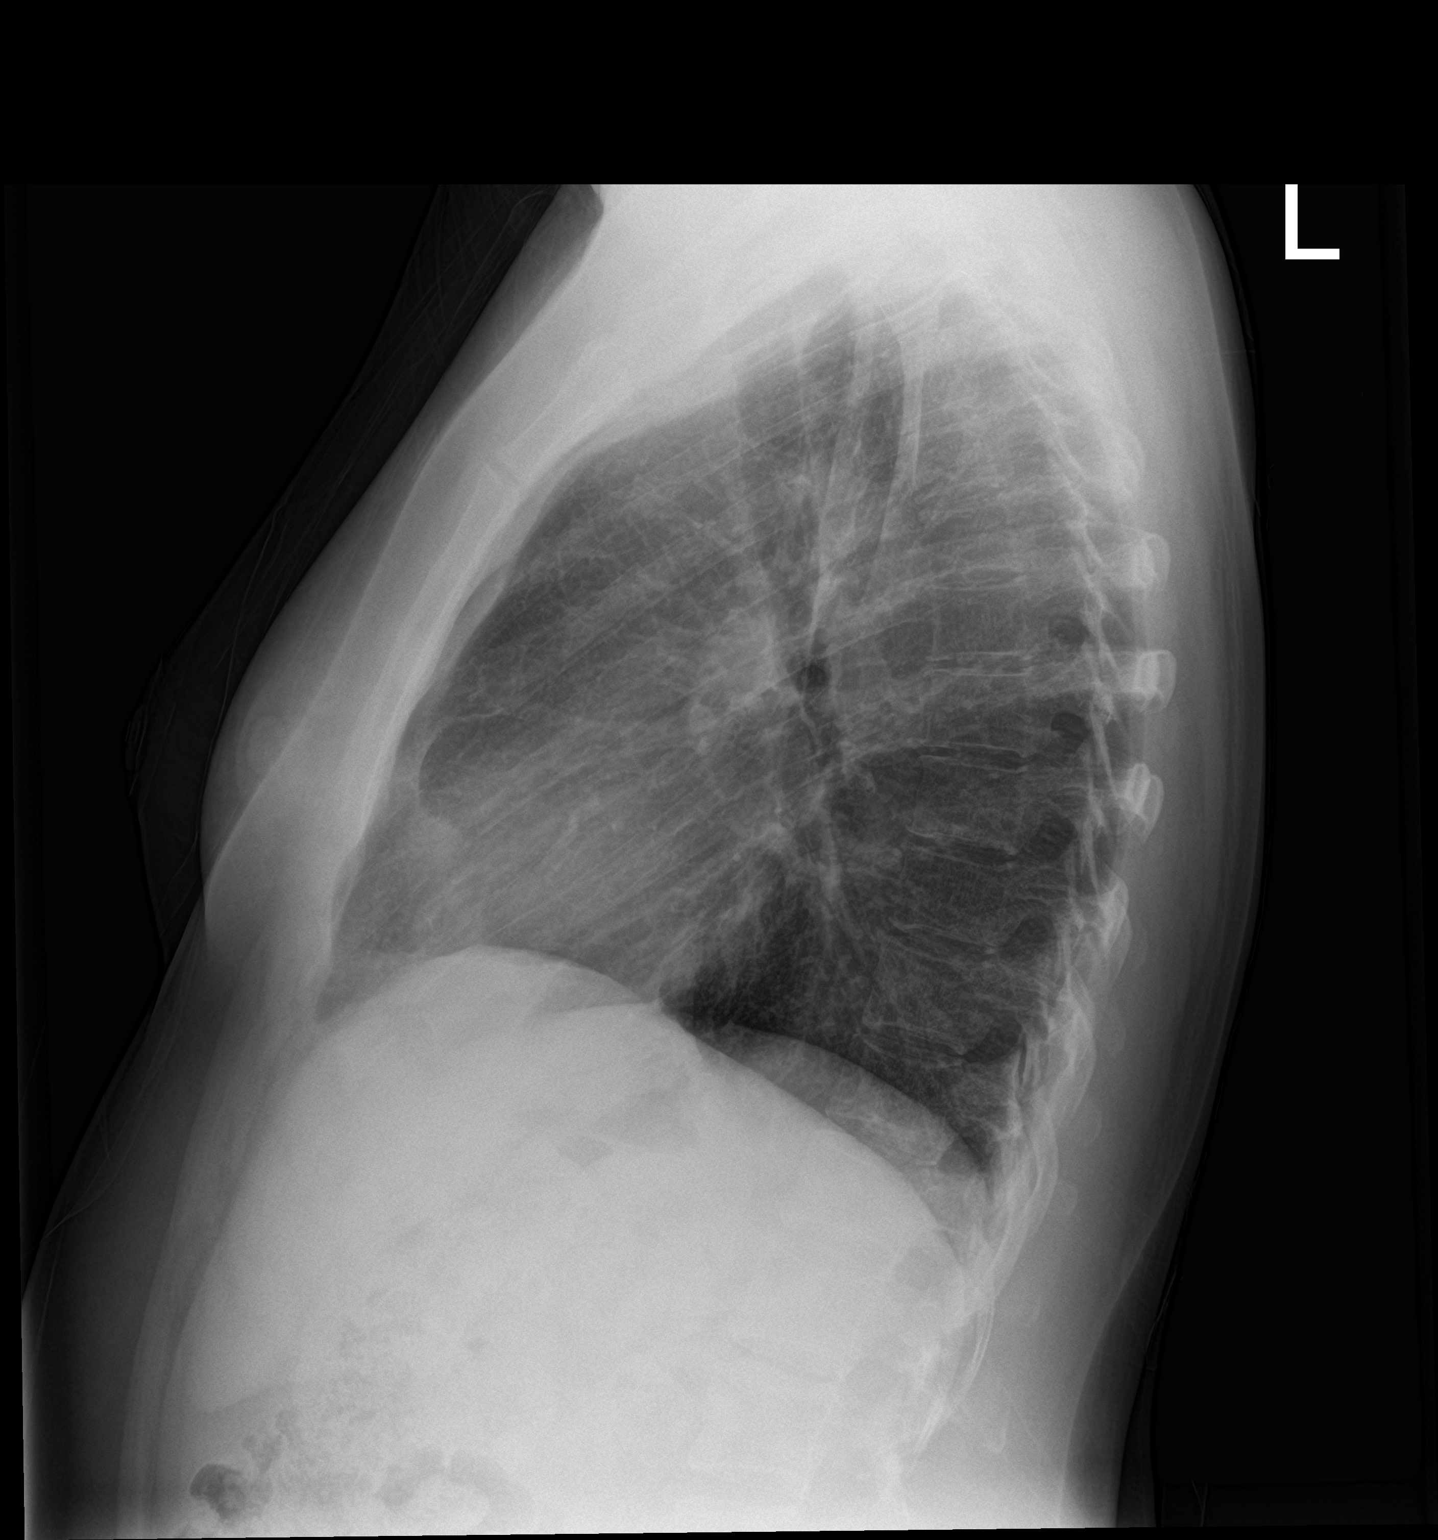

[2 of 2 positions shown; findings below may reference images not displayed]

FINDINGS: The heart size and mediastinal contours are within normal limits.
Small streaky lingular opacity. Right lung is clear. No pleural
effusion. No pneumothorax. The visualized skeletal structures are
unremarkable.
IMPRESSION: Small streaky lingular opacity may represent atelectasis or
pneumonia. Lungs are otherwise clear.

## 2022-01-23 ENCOUNTER — Other Ambulatory Visit (HOSPITAL_COMMUNITY)
Admission: EM | Admit: 2022-01-23 | Discharge: 2022-01-26 | Disposition: A | Discharge status: Other Acute Inpatient Hospital | Payer: 59 | Attending: Psychiatry | Admitting: Psychiatry

## 2022-01-23 DIAGNOSIS — F1420 Cocaine dependence, uncomplicated: Secondary | ICD-10-CM | POA: Diagnosis not present

## 2022-01-23 DIAGNOSIS — Z1152 Encounter for screening for COVID-19: Secondary | ICD-10-CM | POA: Diagnosis not present

## 2022-01-23 DIAGNOSIS — F909 Attention-deficit hyperactivity disorder, unspecified type: Secondary | ICD-10-CM | POA: Insufficient documentation

## 2022-01-23 DIAGNOSIS — B1920 Unspecified viral hepatitis C without hepatic coma: Secondary | ICD-10-CM | POA: Diagnosis not present

## 2022-01-23 DIAGNOSIS — I10 Essential (primary) hypertension: Secondary | ICD-10-CM | POA: Insufficient documentation

## 2022-01-23 DIAGNOSIS — F329 Major depressive disorder, single episode, unspecified: Secondary | ICD-10-CM | POA: Diagnosis not present

## 2022-01-23 DIAGNOSIS — R69 Illness, unspecified: Secondary | ICD-10-CM | POA: Diagnosis not present

## 2022-01-23 DIAGNOSIS — F17210 Nicotine dependence, cigarettes, uncomplicated: Secondary | ICD-10-CM | POA: Diagnosis not present

## 2022-01-23 DIAGNOSIS — B182 Chronic viral hepatitis C: Secondary | ICD-10-CM | POA: Diagnosis not present

## 2022-01-23 DIAGNOSIS — F1124 Opioid dependence with opioid-induced mood disorder: Secondary | ICD-10-CM | POA: Insufficient documentation

## 2022-01-23 DIAGNOSIS — F1120 Opioid dependence, uncomplicated: Secondary | ICD-10-CM | POA: Diagnosis present

## 2022-01-23 LAB — COMPREHENSIVE METABOLIC PANEL
ALT: 35 U/L (ref 0–44)
AST: 33 U/L (ref 15–41)
Albumin: 3.8 g/dL (ref 3.5–5.0)
Alkaline Phosphatase: 73 U/L (ref 38–126)
Anion gap: 6 (ref 5–15)
BUN: 10 mg/dL (ref 6–20)
CO2: 26 mmol/L (ref 22–32)
Calcium: 9.1 mg/dL (ref 8.9–10.3)
Chloride: 107 mmol/L (ref 98–111)
Creatinine, Ser: 0.64 mg/dL (ref 0.44–1.00)
GFR, Estimated: >60 mL/min (ref >60)
Glucose, Bld: 94 mg/dL (ref 70–99)
Potassium: 3.6 mmol/L (ref 3.5–5.1)
Sodium: 139 mmol/L (ref 135–145)
Total Bilirubin: 0.4 mg/dL (ref 0.3–1.2)
Total Protein: 7.4 g/dL (ref 6.5–8.1)

## 2022-01-23 LAB — CBC WITH DIFFERENTIAL/PLATELET
Abs Immature Granulocytes: 0.01 10*3/uL (ref 0.00–0.07)
Basophils Absolute: 0 10*3/uL (ref 0.0–0.1)
Basophils Relative: 1 %
Eosinophils Absolute: 0.1 10*3/uL (ref 0.0–0.5)
Eosinophils Relative: 2 %
HCT: 40.2 % (ref 36.0–46.0)
Hemoglobin: 13.7 g/dL (ref 12.0–15.0)
Immature Granulocytes: 0 %
Lymphocytes Relative: 47 %
Lymphs Abs: 2.9 10*3/uL (ref 0.7–4.0)
MCH: 29.6 pg (ref 26.0–34.0)
MCHC: 34.1 g/dL (ref 30.0–36.0)
MCV: 86.8 fL (ref 80.0–100.0)
Monocytes Absolute: 0.5 10*3/uL (ref 0.1–1.0)
Monocytes Relative: 8 %
Neutro Abs: 2.6 10*3/uL (ref 1.7–7.7)
Neutrophils Relative %: 42 %
Platelets: 191 10*3/uL (ref 150–400)
RBC: 4.63 MIL/uL (ref 3.87–5.11)
RDW: 13.9 % (ref 11.5–15.5)
WBC: 6.2 10*3/uL (ref 4.0–10.5)
nRBC: 0 % (ref 0.0–0.2)

## 2022-01-23 LAB — HIV ANTIBODY (ROUTINE TESTING W REFLEX): HIV Screen 4th Generation wRfx: NONREACTIVE

## 2022-01-23 LAB — RESP PANEL BY RT-PCR (FLU A&B, COVID) ARPGX2
Influenza A by PCR: NEGATIVE
Influenza B by PCR: NEGATIVE
SARS Coronavirus 2 by RT PCR: NEGATIVE

## 2022-01-23 LAB — TSH: TSH: 0.649 u[IU]/mL (ref 0.350–4.500)

## 2022-01-23 LAB — ETHANOL: Alcohol, Ethyl (B): <10 mg/dL (ref <10)

## 2022-01-23 LAB — HEPATITIS PANEL, ACUTE
HCV Ab: REACTIVE — AB
Hep A IgM: NONREACTIVE
Hep B C IgM: NONREACTIVE
Hepatitis B Surface Ag: NONREACTIVE

## 2022-01-23 LAB — LIPID PANEL
Cholesterol: 194 mg/dL (ref 0–200)
HDL: 36 mg/dL — ABNORMAL LOW (ref >40)
LDL Cholesterol: 129 mg/dL — ABNORMAL HIGH (ref 0–99)
Total CHOL/HDL Ratio: 5.4 RATIO
Triglycerides: 145 mg/dL (ref <150)
VLDL: 29 mg/dL (ref 0–40)

## 2022-01-23 LAB — POC SARS CORONAVIRUS 2 AG: SARSCOV2ONAVIRUS 2 AG: NEGATIVE

## 2022-01-23 MED ORDER — ALUM & MAG HYDROXIDE-SIMETH 200-200-20 MG/5ML PO SUSP: 30.0000 mL | ORAL | Status: DC | PRN

## 2022-01-23 MED ORDER — METHOCARBAMOL 500 MG PO TABS: 500.0000 mg | ORAL_TABLET | Freq: Three times a day (TID) | ORAL | Status: DC | PRN

## 2022-01-23 MED ORDER — CLONIDINE HCL 0.1 MG PO TABS: 0.1000 mg | ORAL_TABLET | Freq: Every day | ORAL | Status: DC

## 2022-01-23 MED ORDER — NICOTINE 21 MG/24HR TD PT24
21.0000 mg | MEDICATED_PATCH | Freq: Every day | TRANSDERMAL | Status: DC
  Administered 2022-01-23 – 2022-01-26 (×4): 21 mg via TRANSDERMAL
  Filled 2022-01-23 (×4): qty 1

## 2022-01-23 MED ORDER — NICOTINE POLACRILEX 2 MG MT GUM: 2.0000 mg | CHEWING_GUM | OROMUCOSAL | Status: DC | PRN

## 2022-01-23 MED ORDER — ONDANSETRON 4 MG PO TBDP: 4.0000 mg | ORAL_TABLET | Freq: Four times a day (QID) | ORAL | Status: DC | PRN

## 2022-01-23 MED ORDER — LOPERAMIDE HCL 2 MG PO CAPS: 2.0000 mg | ORAL_CAPSULE | ORAL | Status: DC | PRN

## 2022-01-23 MED ORDER — CLONIDINE HCL 0.1 MG PO TABS
0.1000 mg | ORAL_TABLET | Freq: Four times a day (QID) | ORAL | Status: AC
  Administered 2022-01-23 – 2022-01-25 (×7): 0.1 mg via ORAL
  Filled 2022-01-23 (×7): qty 1

## 2022-01-23 MED ORDER — DICYCLOMINE HCL 20 MG PO TABS: 20.0000 mg | ORAL_TABLET | Freq: Four times a day (QID) | ORAL | Status: DC | PRN

## 2022-01-23 MED ORDER — ACETAMINOPHEN 325 MG PO TABS: 650.0000 mg | ORAL_TABLET | Freq: Four times a day (QID) | ORAL | Status: DC | PRN

## 2022-01-23 MED ORDER — CLONIDINE HCL 0.1 MG PO TABS
0.1000 mg | ORAL_TABLET | ORAL | Status: DC
  Administered 2022-01-26: 0.1 mg via ORAL
  Filled 2022-01-23: qty 1

## 2022-01-23 MED ORDER — MAGNESIUM HYDROXIDE 400 MG/5ML PO SUSP: 30.0000 mL | Freq: Every day | ORAL | Status: DC | PRN

## 2022-01-23 MED ORDER — NAPROXEN 500 MG PO TABS
500.0000 mg | ORAL_TABLET | Freq: Two times a day (BID) | ORAL | Status: DC | PRN
  Administered 2022-01-25: 500 mg via ORAL
  Filled 2022-01-23: qty 1

## 2022-01-23 MED ORDER — HYDROXYZINE HCL 25 MG PO TABS
25.0000 mg | ORAL_TABLET | Freq: Four times a day (QID) | ORAL | Status: DC | PRN
  Administered 2022-01-25 – 2022-01-26 (×3): 25 mg via ORAL
  Filled 2022-01-23 (×3): qty 1

## 2022-01-23 NOTE — BH Assessment (Addendum)
Comprehensive Clinical Assessment (CCA) Note  01/23/2022 Ashok CroonGlenda A Pogue [id number]  Disposition: Patient is Urgent. [facility]    Chief Complaint:  Chief Complaint  Patient presents with   Addiction Problem   Visit Diagnosis:  Cocaine Use Disorder, Severe Cannabis Use Disorder, Severe Opioid Use Disorder, Severe  Drucie OpitzGlenda Prather is a 41 y/o female [facility]. Per patient, "I want detox from cocaine and Suboxone". Patient started using cocaine at the age of 41 years old. She uses cocaine (8 ball or more) per day. Route of use is intravenously and smokes. Last use of cocaine was 45 minutes prior to arrival. She also takes unprescribed Suboxone x10 years (8 milligrams) per day. She drinks alcohol (#1 4o ounce) daily to every other day. Last drink was last night (#32 ounce beer). Longest period of sobriety was 60 days due to incarceration. She typically has withdrawal symptoms w/o Suboxone use including cramps, loss of appetite, diarrhea, anxiety, and cold sweats. No hx of substance abuse treatment. No SI, HI, and AVH's. Currently homeless (2 weeks). Previously living with her mother stating she was kicked out on Thanksgiving Day.Patient requesting detox and long term treatment. Patient is calm and cooperative. *She mentions that her hand is swollen from intravenous use of cocaine.   CCA Screening, Triage and Referral (STR)  Patient Reported Information How did you hear about us? Family/Friend  What Is the Reason for Your Visit/Call Today? Requesting Detox.  How Long Has This Been Causing You Problems? > than 6 months  What Do You Feel Would Help You the Most Today? Treatment for Depression or other mood problem; Medication(s)   Have You Recently Had Any Thoughts About Hurting Yourself? No  Are You Planning to Commit Suicide/Harm Yourself At This time? No    Have you Recently Had Thoughts About Hurting Someone Karolee Ohslse? No  Are You Planning to Harm  Someone at This Time? No  Explanation: Patient is calm and cooperative.   Have You Used Any Alcohol or Drugs in the Past 24 Hours? Yes  What Did You Use and How Much? Per patient, "I want detox from cocaine and Suboxone". Patient started using cocaine at the age of 41 years old. She uses cocaine (8 ball or more) per day. Route of use is intravenously and smokes. Last use of cocaine was 45 minutes prior to arrival. She also takes unprescribed Suboxone x10 years (8 milligrams) per day. She drinks alcohol (#1 4o ounce) daily to every other day. Last drink was last night (#32 ounce beer). Longest period of sobriety was 60 days due to incarceration   Do You Currently Have a Therapist/Psychiatrist? No  Name of Therapist/Psychiatrist: Name of Therapist/Psychiatrist: No therapist and/or psychiatrist.   Have You Been Recently Discharged From Any Office Practice or Programs? No  Explanation of Discharge From Practice/Program: n/a     CCA Screening Triage Referral Assessment Type of Contact: Face-to-Face  Telemedicine Service Delivery:   Is this Initial or Reassessment? Is this Initial or Reassessment?: Initial Assessment  Date Telepsych consult ordered in [facility]:    Time Telepsych consult ordered in [facility]:    [facility]  [facility]   Collateral Involvement: No collateral information obtained.   Does Patient Have a Automotive engineerCourt Appointed Legal Guardian? No  Legal Guardian Contact Information: No legal guardian  Copy of Legal Guardianship Form: No - copy requested  Legal Guardian Notified of Arrival: -- (n/a)  Legal Guardian Notified of Pending Discharge: -- (n/a)  If Minor and Not Living with Parent(s), Who has Custody? n/a  Is CPS involved or ever been involved? Never  Is APS involved or ever been involved? Never   Patient Determined To Be At Risk for Harm To Self or Others Based on Review of Patient Reported  Information or Presenting Complaint? No  Method: No Plan  Availability of Means: No access or NA  Intent: Vague intent or NA  Notification Required: No need or identified person  Additional Information for Danger to Others Potential: No data recorded Additional Comments for Danger to Others Potential: n/a  Are There Guns or Other Weapons in Your Home? No  Types of Guns/Weapons: N/A  Are These Weapons Safely Secured?                            No  Who Could Verify You Are Able To Have These Secured: n/a  Do You Have any Outstanding Charges, Pending Court Dates, Parole/Probation? n/a  Contacted To Inform of Risk of Harm To Self or Others: No data recorded   Does Patient Present under Involuntary Commitment? No    [place] of Residence: [place]   Patient Currently Receiving the Following Services: -- (Patient has no outpatient therapuetic services in place at this time.)   Determination of Need: Routine (7 days)   Options For Referral: Medication Management; Facility-Based Crisis; Outpatient Therapy; Chemical Dependency Intensive Outpatient Therapy (CDIOP)     CCA Biopsychosocial Patient Reported Schizophrenia/Schizoaffective Diagnosis in Past: No   Strengths: N/A   Mental Health Symptoms Depression:   Difficulty Concentrating; Hopelessness; Change in energy/activity; Tearfulness   Duration of Depressive symptoms:  Duration of Depressive Symptoms: Greater than two weeks   Mania:  No data recorded  Anxiety:    Difficulty concentrating; Fatigue; Irritability; Restlessness; Sleep; Tension; Worrying   Psychosis:   None   Duration of Psychotic symptoms:    Trauma:   None   Obsessions:   None   Compulsions:   None   Inattention:   None   Hyperactivity/Impulsivity:   None   Oppositional/Defiant Behaviors:  No data recorded  Emotional Irregularity:   None   Other Mood/Personality Symptoms:   Calm and cooperative.    Mental Status  Exam Appearance and self-care  Stature:   Average   Weight:   Average weight   Clothing:   Neat/clean   Grooming:   Normal   Cosmetic use:   Age appropriate   Posture/gait:   Normal   Motor activity:   Not Remarkable   Sensorium  Attention:   Normal   Concentration:   Normal   Orientation:   Time; Situation; Place; Person; Object   Recall/memory:  No data recorded  Affect and Mood  Affect:   Depressed; Flat   Mood:   Depressed   Relating  Eye contact:   Normal   Facial expression:   Depressed   Attitude toward examiner:   Cooperative   Thought and Language  Speech flow:  Clear and Coherent   Thought content:   Appropriate to Mood and Circumstances   Preoccupation:   None   Hallucinations:   None   Organization:   Coherent   [employer] of Knowledge:   Fair   Intelligence:   Average   Abstraction:   Normal   Judgement:   Fair   Dance movement psychotherapist:   Adequate   Insight:  Fair   Decision Making:   Normal   Social Functioning  Social Maturity:  No data recorded  Social Judgement:   Normal   Stress  Stressors:  No data recorded  Coping Ability:   Normal   Skill Deficits:   Communication   Supports:   Support needed     Religion: Religion/Spirituality Are You A Religious Person?: No  Leisure/Recreation: Leisure / Recreation Do You Have Hobbies?: No  Exercise/Diet: Exercise/Diet Do You Exercise?: No Have You Gained or Lost A Significant Amount of Weight in the Past Six Months?: No Do You Follow a Special Diet?: No Do You Have Any Trouble Sleeping?: Yes Explanation of Sleeping Difficulties: varies   CCA Employment/Education Employment/Work Situation: Employment / Work Situation Employment Situation: Unemployed Patient's Job has Been Impacted by Current Illness: No Has Patient ever Been in Equities trader?: No  Education: Education Is Patient Currently Attending School?: No Last Grade  Completed:  (unknown) Did You Product manager?: No Did You Have An Individualized Education Program (IIEP): No Did You Have Any Difficulty At School?: No Patient's Education Has Been Impacted by Current Illness: No   CCA Family/Childhood History Family and Relationship History: Family history Marital status: Single Does patient have children?:  (unknown)  Childhood History:  Childhood History By whom was/is the patient raised?: Mother, Both parents Did patient suffer any verbal/emotional/physical/sexual abuse as a child?: No Did patient suffer from severe childhood neglect?: No Has patient ever been sexually abused/assaulted/raped as an adolescent or adult?: No Was the patient ever a victim of a crime or a disaster?: No Witnessed domestic violence?: No Has patient been affected by domestic violence as an adult?: No       CCA Substance Use Alcohol/Drug Use: Alcohol / Drug Use Pain Medications: See MAR  Prescriptions: See MAR  Over the Counter: See MAR  History of alcohol / drug use?: Yes Longest period of sobriety (when/how long): 6 months Negative Consequences of Use: Work / Programmer, multimedia, Copywriter, advertising relationships, Armed forces operational officer, Surveyor, quantity Withdrawal Symptoms: Other (Comment) Substance #1 Name of Substance 1: Cocaine 1 - Age of First Use: 41 years old 1 - Amount (size/oz): 8 ball or more per day 1 - Frequency: daily 1 - Duration: on-going 1 - Last Use / Amount: 45 minutes prior to arrival 1 - Method of Aquiring: varies 1- Route of Use: IV and smoking Substance #2 Name of Substance 2: She takes unprescribed Suboxone x10 years (8 milligrams) per day. 2 - Age of First Use: 41 years old 2 - Amount (size/oz): 8 milligrams per day 2 - Frequency: daily 2 - Duration: 10 years 2 - Last Use / Amount: Yesterday 2 - Method of Aquiring: "I get it off the street" 2 - Route of Substance Use: Oral Substance #3 Name of Substance 3: She drinks alcohol (#1 4o ounce) daily to every other day. Last  drink was last night (#32 ounce beer). 3 - Age of First Use: early 20's 3 - Amount (size/oz): #1 (40 ounce per day) 3 - Frequency: Daily 3 - Duration: On-going 3 - Last Use / Amount: #32 ounce beer 3 - Method of Aquiring: Store 3 - Route of Substance Use: Oral                   ASAM's:  Six Dimensions of Multidimensional Assessment  Dimension 1:  Acute Intoxication and/or Withdrawal Potential:      Dimension 2:  Biomedical Conditions and Complications:      Dimension 3:  Emotional,  Behavioral, or Cognitive Conditions and Complications:     Dimension 4:  Readiness to Change:     Dimension 5:  Relapse, Continued use, or Continued Problem Potential:     Dimension 6:  Recovery/Living Environment:     ASAM Severity Score:    ASAM Recommended Level of Treatment:     Substance use Disorder (SUD) Substance Use Disorder (SUD)  Checklist Symptoms of Substance Use: Continued use despite having a persistent/recurrent physical/psychological problem caused/exacerbated by use, Evidence of tolerance, Large amounts of time spent to obtain, use or recover from the substance(s), Evidence of withdrawal (Comment), Continued use despite persistent or recurrent social, interpersonal problems, caused or exacerbated by use, Persistent desire or unsuccessful efforts to cut down or control use, Repeated use in physically hazardous situations, Substance(s) often taken in larger amounts or over longer times than was intended, Recurrent use that results in a failure to fulfill major role obligations (work, school, home), Social, occupational, recreational activities given up or reduced due to use, Presence of craving or strong urge to use  Recommendations for Services/Supports/Treatments: Recommendations for Services/Supports/Treatments Recommendations For Services/Supports/Treatments: Detox, Medication Management, CD-IOP Intensive Chemical Dependency Program, SAIOP (Substance Abuse Intensive Outpatient  Program), Peer Support, Facility Based Crisis  Discharge Disposition:    DSM5 Diagnoses: Patient Active Problem List   Diagnosis Date Noted   Opiate addiction (HCC) 01/23/2022   Polysubstance abuse (HCC)      Referrals to Alternative Service(s): Referred to Alternative Service(s):   Place:   Date:   Time:    Referred to Alternative Service(s):   Place:   Date:   Time:    Referred to Alternative Service(s):   Place:   Date:   Time:    Referred to Alternative Service(s):   Place:   Date:   Time:     Melynda Ripple, Counselor

## 2022-01-23 NOTE — Discharge Instructions (Addendum)
[facility] will pick you up from [facility] and transport you there 12/9 approximately 2 PM.     [facility] [address] [place], [place], [place] [address] Hours: Mon-Fri 8AM-5PM Phone: [phone] Fax: [phone]  Services Substance Abuse Outpatient Treatment Mental Health Outpatient Treatment Psychiatric Services/ Medical Services Advanced Access/Walk-In Clinic Mobile Crisis Management Services ACTT (Assertive Community Treatment Team) Dialectic Behavioral Therapy Critical Time Intervention Psychosocial Rehabilitation [facility]) Medication Assisted Treatment    A referral for [facility] has been placed.  Please call to schedule your appointment. [facility]  [address] [place] [address] [place], [place] [address]  Phone: [phone] Fax: [phone]  Monday - Thu  8:30 a.m. - 5 p.m. Fri - 8 am - Noon Closed daily for lunch 12:30 - 1:30 p.m.

## 2022-01-23 NOTE — ED Provider Notes (Signed)
Facility Based Crisis Admission H&P  Date: 01/23/22 Patient Name: Shelly George MRN: [id number] Chief Complaint:  Chief Complaint  Patient presents with   Addiction Problem      Reason for Admission: Shelly George is a 41 y.o. female w/ hx of opiate use disorder and stimulant use disorder, hypertension presenting to [facility] voluntarily requesting detox from suboxone and cocaine.   Diagnoses:  Final diagnoses:  Opioid dependence with opioid-induced mood disorder (HCC)  Cocaine use disorder, severe, in controlled environment [facility])    HPI:  Patient states last use of Suboxone 8-2 strip was 1 hour prior to arrival. She states she also used cocaine around that time as well.  Patient states she is seeking detox but she wants to quit using Suboxone as a way to manage her opiate use disorder.  She was also concerned with severe withdrawal symptoms and wants to be in a safe environment in order to do so.  She also states that she gets severe anxiety when she is withdrawing form suboxone.  She states that she wants to quit Suboxone as she has noticed that she is behaving in the same way as she did when she was addicted to heroin.    Patient states she wants to quit because she does not want to depend on finding Suboxone.  She states that she had severe pain after having her twins 13 years ago and after being unable to afford pain pills, started to use heroin. After a 60 day incarceration for possession of drug paraphernalia, she started buying suboxone off the street. She states she uses 1 strip per day but when she is unable to afford or locate someone who would sell her Suboxone, she will use one quarter of a strip per day.  She states when she withdraws from opiates, she would experience loss of appetite, abdominal cramping, insomnia, cold sweats, severe anxiety.  She states that withdrawing from Suboxone is not nearly as bad as heroin but she is very concerned that she will not be able to appropriately  manage withdrawal symptoms without being in a controlled facility.  Patient states that she uses cocaine daily for the past 10 years.  She states that this was primarily for energy and she has been using an 8 ball a day. She states she's snorted, injected, and smoked cocaine. She briefly used crystal meth twice. She used cannabis briefly as well years ago but did not like the way it made her feel. She states she uses 1 ppd of tobacco. She states she drinks 1 40 oz of beer every other day. She previously severe alcohol use up until 2004 but has not drank heavily since then.  Patient does endorse MDD symptoms including anhedonia, poor sleep, poor energy/concentration, guilt.  She denies present SI/HI.  She denies present AVH.    Patient does endorse history of manic symptoms but only within the context of stimulant use.  She endorses visual hallucinations when she is withdrawing from opiates but has not experienced psychosis outside of this.  She denies paranoia or ideas of reference.   Patient endorses history of trauma but does not disclose what trauma she has been through.  She does state that she has had hypervigilance and avoidance but no flashbacks or nightmares.  She was living with mom up until 2 weeks ago after a disagreement. Mom is recovering addict. Patient has been living with friend since then.  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  Past Psychiatric Hx: Previous Psych Diagnoses: ADHD Prior inpatient treatment: denies Current/prior outpatient treatment: denies Psychotherapy BJ:[id number]:denies History of suicide:denies History of homicide: denies Psychiatric medication history: ritalin (ADHD), depakote (seizures), phenobarbital (seizures). Psychiatric medication compliance history:n/a Neuromodulation history: denies Current Psychiatrist: denies (last saw [facility] 12-13 years  ago) Current therapist: none  PHQ 2-9: 1  Total Time spent with patient: 45 minutes  Musculoskeletal  Strength & Muscle Tone: within normal limits Gait & Station: normal Patient leans: N/A  Psychiatric Specialty Exam  Presentation General Appearance: Appropriate for Environment; Casual  Eye Contact:Good  Speech:Clear and Coherent; Normal Rate  Speech Volume:Normal  Handedness:No data recorded  Mood and Affect  Mood:Anxious; Euphoric  Affect:Labile   Thought Process  Thought Processes:Coherent; Goal Directed; Linear  Descriptions of Associations:Intact  Orientation:Full (Time, Place and Person)  Thought Content:Logical    Hallucinations:Hallucinations: None  Ideas of Reference:None  Suicidal Thoughts:Suicidal Thoughts: No  Homicidal Thoughts:Homicidal Thoughts: No   Sensorium  Memory:Immediate Good; Recent Good; Remote Good  Judgment:Impaired  Insight:Fair   Executive Functions  Concentration:Poor  Attention Span:Fair  Recall:Fair  Fund of Knowledge:Fair  Language:Fair   Psychomotor Activity  Psychomotor Activity:Psychomotor Activity: Increased   Assets  Assets:Communication Skills; Desire for Improvement; Leisure Time; Physical Health; Social Support   Sleep  Sleep:Sleep: Poor   Nutritional Assessment (For OBS and FBC admissions only) Has the patient had a weight loss or gain of 10 pounds or more in the last 3 months?: No Has the patient had a decrease in food intake/or appetite?: Yes Does the patient have dental problems?: No Does the patient have eating habits or behaviors that may be indicators of an eating disorder including binging or inducing vomiting?: No Has the patient recently lost weight without trying?: 0 Has the patient been eating poorly because of a decreased appetite?: 0 Malnutrition Screening Tool Score: 0    Physical Exam Constitutional:      General: She is not in acute distress.    Appearance: Normal  appearance. She is toxic-appearing.  HENT:     Head: Normocephalic and atraumatic.     Nose: Rhinorrhea present.  Cardiovascular:     Rate and Rhythm: Normal rate.  Pulmonary:     Effort: Pulmonary effort is normal.     Breath sounds: Normal breath sounds.  Musculoskeletal:        General: Normal range of motion.     Cervical back: Normal range of motion.  Skin:    General: Skin is warm and dry.  Neurological:     General: No focal deficit present.     Mental Status: She is alert and oriented to person, place, and time.    ROS  Blood pressure (!) 171/96, pulse 85, temperature 97.7 F (36.5 C), temperature source Oral, resp. rate 18, SpO2 96 %. There is no height or weight on file to calculate BMI.   Is the patient at risk to self? Yes  Has the patient been a risk to self in the past 6 months? Yes .    Has the patient been a risk to self within the distant past? Yes   Is the patient a risk to others? No   Has the patient been a risk to others in the past 6 months? No   Has the patient been a risk to others within the distant past? No   Past Medical History:  Past Medical  History:  Diagnosis Date   Anxiety    Drug abuse and dependence (HCC)    opiates, suboxone   Seizure (HCC)    No past surgical history on file.  Family History:  Family History  Problem Relation Age of Onset   Cancer Mother     Social History:  Social History   Socioeconomic History   Marital status: Legally Separated    Spouse name: Not on file   Number of children: Not on file   Years of education: Not on file   Highest education level: Not on file  Occupational History   Not on file  Tobacco Use   Smoking status: Every Day    Packs/day: 0.50    Types: Cigarettes   Smokeless tobacco: Never  Substance and Sexual Activity   Alcohol use: Yes   Drug use: Yes    Types: Oxycodone    Comment: suboxene-street drug used 5 days ago   Sexual activity: Yes  Other Topics Concern   Not on file   Social History Narrative   Not on file   Social Determinants of Health   Financial Resource Strain: Not on file  Food Insecurity: Not on file  Transportation Needs: Not on file  Physical Activity: Not on file  Stress: Not on file  Social Connections: Not on file  Intimate Partner Violence: Not on file    SDOH:  SDOH Screenings   Depression (PHQ2-9): Low Risk  (01/23/2022)  Tobacco Use: High Risk (12/31/2018)    Last Labs:  No visits with results within 6 Month(s) from this visit.  Latest known visit with results is:  Admission on 12/31/2018, Discharged on 12/31/2018  Component Date Value Ref Range Status   WBC 12/31/2018 6.9  4.0 - 10.5 K/uL Final   RBC 12/31/2018 4.45  3.87 - 5.11 MIL/uL Final   Hemoglobin 12/31/2018 13.6  12.0 - 15.0 g/dL Final   HCT 70/78/6754 40.6  36.0 - 46.0 % Final   MCV 12/31/2018 91.2  80.0 - 100.0 fL Final   MCH 12/31/2018 30.6  26.0 - 34.0 pg Final   MCHC 12/31/2018 33.5  30.0 - 36.0 g/dL Final   RDW 49/20/1007 12.8  11.5 - 15.5 % Final   Platelets 12/31/2018 202  150 - 400 K/uL Final   nRBC 12/31/2018 0.0  0.0 - 0.2 % Final   Performed at [facility], [address] [place], [place] [address]   Sodium 12/31/2018 139  135 - 145 mmol/L Final   Potassium 12/31/2018 3.9  3.5 - 5.1 mmol/L Final   Chloride 12/31/2018 106  98 - 111 mmol/L Final   CO2 12/31/2018 24  22 - 32 mmol/L Final   Glucose, Bld 12/31/2018 94  70 - 99 mg/dL Final   BUN 58/83/2549 12  6 - 20 mg/dL Final   Creatinine, Ser 12/31/2018 0.74  0.44 - 1.00 mg/dL Final   Calcium 82/64/1583 9.2  8.9 - 10.3 mg/dL Final   Total Protein 09/40/7680 6.6  6.5 - 8.1 g/dL Final   Albumin 88/12/313 3.5  3.5 - 5.0 g/dL Final   AST 94/58/5929 24  15 - 41 U/L Final   ALT 12/31/2018 28  0 - 44 U/L Final   Alkaline Phosphatase 12/31/2018 78  38 - 126 U/L Final   Total Bilirubin 12/31/2018 0.3  0.3 - 1.2 mg/dL Final   GFR calc non Af Amer 12/31/2018 >60  >60 mL/min Final   GFR calc  Af Amer 12/31/2018 >60  >  60 mL/min Final   Anion gap 12/31/2018 9  5 - 15 Final   Performed at [facility], [address] [place], [place] [address]   Troponin I (High Sensitivity) 12/31/2018 <2  <18 ng/L Final   Comment: (NOTE) Elevated high sensitivity troponin I (hsTnI) values and significant  changes across serial measurements may suggest ACS but many other  chronic and acute conditions are known to elevate hsTnI results.  Refer to the "Links" section for chest pain algorithms and additional  guidance. Performed at [facility], [address] [place], [place] [address]    Preg Test, Ur 12/31/2018 NEGATIVE  NEGATIVE Final   Comment:        THE SENSITIVITY OF THIS METHODOLOGY IS >24 mIU/mL     Allergies: Codeine and Tegretol [carbamazepine]  PTA Medications: (Not in a hospital admission)   Long Term Goals: Improvement in symptoms so as ready for discharge  Short Term Goals: Patient will verbalize feelings in meetings with treatment team members., Patient will attend at least of 50% of the groups daily., Pt will complete the PHQ9 on admission, day 3 and discharge., Patient will participate in completing the [place] Suicide Severity Rating Scale, Patient will score a low risk of violence for 24 hours prior to discharge, and Patient will take medications as prescribed daily.  Medical Decision Making  Admit to [facility] -Voluntary admission -Clonidine taper -Opiate withdrawal PRNs -COWS -Labs: CBC, CMP, Lipid panel, A1c, ECG, hepatitis panel, UDS, UA, RPR, HIV, TSH, UA   Recommendations  Based on my evaluation the patient does not appear to have an emergency medical condition.  Park Pope, MD 01/23/22  4:40 PM

## 2022-01-23 NOTE — ED Notes (Signed)
Pt resting in bed. A&O x4, calm and cooperative. Denies current SI/HI/AVH. No signs of distress noted. Monitoring for safety. 

## 2022-01-23 NOTE — ED Notes (Signed)
Patient requested nicotine patch.

## 2022-01-23 NOTE — ED Notes (Signed)
Pt admitted to [facility] requesting detox from cocaine and Suboxone. At current, denies withdrawal sx. Denies SI/HI/AVH. Cooperative throughout interview process. Pt states, "I just used cocaine before I came in here. I need help". Support and encouragement provided. Informed pt to notify staff with any needs or concerns. Will monitor for safety.

## 2022-01-23 NOTE — Progress Notes (Signed)
   01/23/22 1513  [facility] Triage Screening (Walk-ins at [facility] only)  What Is the Reason for Your Visit/Call Today? Shelly George is a 41 y/o female presenting to the [facility]. Per patient, "I want detox from cocaine and Suboxone". Patient started using cocaine at the age of 41 years old. She uses cocaine (8 ball or more) per day. Route of use is intravenously and smokes. Last use of cocaine was 45 minutes prior to arrival. She also takes unprescribed Suboxone x10 years (8 milligrams) per day. She drinks alcohol (#1 4o ounce) daily to every other day. Last drink was last night (#32 ounce beer). Longest period of sobriety was 60 days due to incarceration. She typically has withdrawal symptoms w/o Suboxone use including cramps, loss of appetite, diarrhea, anxiety, and cold sweats. No hx of substance abuse treatment. No SI, HI, and AVH's. Currently homeless (2 weeks). Previously living with her mother stating she was kicked out on Thanksgiving Day.Patient requesting detox and long term treatment. Patient is calm and cooperative. *She mentions that her hand is swollen from intravenous use of cocaine.  How Long Has This Been Causing You Problems? > than 6 months  Have You Recently Had Any Thoughts About Hurting Yourself? No  Are You Planning to Commit Suicide/Harm Yourself At This time? No  Have you Recently Had Thoughts About Hurting Someone Shelly George? No  Are You Planning To Harm Someone At This Time? No  Are you currently experiencing any auditory, visual or other hallucinations? No  Have You Used Any Alcohol or Drugs in the Past 24 Hours? Yes  How long ago did you use Drugs or Alcohol? Per patient, "I want detox from cocaine and Suboxone". Patient started using cocaine at the age of 41 years old. She uses cocaine (8 ball or more) per day. Route of use is intravenously and smokes. Last use of cocaine was 45 minutes prior to arrival. She also takes unprescribed Suboxone x10 years (8 milligrams) per day. She drinks alcohol  (#1 4o ounce) daily to every other day. Last drink was last night (#32 ounce beer). Longest period of sobriety was 60 days due to incarceration.  What Did You Use and How Much? Per patient, "I want detox from cocaine and Suboxone". Patient started using cocaine at the age of 41 years old. She uses cocaine (8 ball or more) per day. Route of use is intravenously and smokes. Last use of cocaine was 45 minutes prior to arrival. She also takes unprescribed Suboxone x10 years (8 milligrams) per day. She drinks alcohol (#1 4o ounce) daily to every other day. Last drink was last night (#32 ounce beer). Longest period of sobriety was 60 days due to incarceration  Do you have any current medical co-morbidities that require immediate attention? No  Clinician description of patient physical appearance/behavior: Patient is calm and cooperative.  What Do You Feel Would Help You the Most Today? Treatment for Depression or other mood problem  If access to [facility] was not available, would you have sought care in the Emergency Department? No  Determination of Need Routine (7 days)  Options For Referral Medication Management;Outpatient Therapy

## 2022-01-24 ENCOUNTER — Encounter (HOSPITAL_COMMUNITY): Payer: Self-pay

## 2022-01-24 DIAGNOSIS — F1124 Opioid dependence with opioid-induced mood disorder: Secondary | ICD-10-CM | POA: Diagnosis not present

## 2022-01-24 LAB — URINALYSIS, ROUTINE W REFLEX MICROSCOPIC
Bilirubin Urine: NEGATIVE
Glucose, UA: NEGATIVE mg/dL
Hgb urine dipstick: NEGATIVE
Ketones, ur: NEGATIVE mg/dL
Leukocytes,Ua: NEGATIVE
Nitrite: NEGATIVE
Protein, ur: NEGATIVE mg/dL
Specific Gravity, Urine: 1.03 (ref 1.005–1.030)
pH: 5 (ref 5.0–8.0)

## 2022-01-24 LAB — POCT URINE DRUG SCREEN - MANUAL ENTRY (I-SCREEN)
POC Amphetamine UR: NOT DETECTED
POC Buprenorphine (BUP): NOT DETECTED
POC Cocaine UR: POSITIVE — AB
POC Marijuana UR: POSITIVE — AB
POC Methadone UR: NOT DETECTED
POC Methamphetamine UR: NOT DETECTED
POC Morphine: NOT DETECTED
POC Oxazepam (BZO): POSITIVE — AB
POC Oxycodone UR: NOT DETECTED
POC Secobarbital (BAR): NOT DETECTED

## 2022-01-24 LAB — POCT PREGNANCY, URINE: Preg Test, Ur: NEGATIVE

## 2022-01-24 LAB — HEMOGLOBIN A1C
Hgb A1c MFr Bld: 5.4 % (ref 4.8–5.6)
Mean Plasma Glucose: 108 mg/dL

## 2022-01-24 LAB — RPR: RPR Ser Ql: NONREACTIVE

## 2022-01-24 LAB — POC URINE PREG, ED: Preg Test, Ur: NEGATIVE

## 2022-01-24 MED ORDER — BUPROPION HCL ER (XL) 150 MG PO TB24
150.0000 mg | ORAL_TABLET | Freq: Every day | ORAL | Status: DC
  Administered 2022-01-24 – 2022-01-26 (×3): 150 mg via ORAL
  Filled 2022-01-24 (×3): qty 1

## 2022-01-24 NOTE — ED Notes (Signed)
Pt asleep in bed. Respirations even and unlabored. Monitoring for safety. 

## 2022-01-24 NOTE — ED Notes (Signed)
Pt sleeping in no acute distress. RR even and unlabored. Environment secured. Will continue to monitor for safety. 

## 2022-01-24 NOTE — Tx Team (Signed)
LCSW met with patient to assess current mood, affect, physical state, and inquire about needs/goals while here in [facility] and after discharge. Patient reports she presented due to needing to get off suboxone. Patient reports she has been using suboxone off the streets for the last 10 years. Patient reports she has also been using an 8 ball of cocaine daily for the last couple of days. Patient denies any other substance and alcohol use. Patient reports she lives at home with a friend who also brought her to the [facility] for help. Patient reports support from friend and denies any safety concerns within the home. Patient denies current having any pending legal charges or court dates. Patient denies any prior history of outpatient or inpatient substance abuse treatment. Patient reports her current goal is to seek outpatient resources for substance use. Patient expressed an interest in intensive outpatient treatment with [facility] in [place], [place]. Patient aware that LCSW will follow up with agency to arrange follow up appt prior to discharge. Patient currently denies any SI/HI/AVH and reports mood as irritable. Patient aware that MD will follow up and address current needs when able. No other needs were reported at this time by patient.   Lucius Conn, LCSW Clinical Social Worker [facility] Ph: [phone]

## 2022-01-24 NOTE — BH IP Treatment Plan (Signed)
Interdisciplinary Treatment and Diagnostic Plan Update  01/24/2022 Time of Session: 9:45AM  Shelly George MRN: [id number]  Diagnosis:  Final diagnoses:  Opioid dependence with opioid-induced mood disorder (HCC)  Cocaine use disorder, severe, in controlled environment (HCC)     Current Medications:  Current Facility-Administered Medications  Medication Dose Route Frequency Provider Last Rate Last Admin   acetaminophen (TYLENOL) tablet 650 mg  650 mg Oral Q6H PRN Park Pope, MD       alum & mag hydroxide-simeth (MAALOX/MYLANTA) 200-200-20 MG/5ML suspension 30 mL  30 mL Oral Q4H PRN Park Pope, MD       cloNIDine (CATAPRES) tablet 0.1 mg  0.1 mg Oral QID Park Pope, MD   0.1 mg at 01/24/22 0920   Followed by   Melene Muller ON 01/26/2022] cloNIDine (CATAPRES) tablet 0.1 mg  0.1 mg Oral Sabino Snipes, MD       Followed by   Melene Muller ON 01/28/2022] cloNIDine (CATAPRES) tablet 0.1 mg  0.1 mg Oral QAC breakfast Park Pope, MD       dicyclomine (BENTYL) tablet 20 mg  20 mg Oral Q6H PRN Park Pope, MD       hydrOXYzine (ATARAX) tablet 25 mg  25 mg Oral Q6H PRN Park Pope, MD       loperamide (IMODIUM) capsule 2-4 mg  2-4 mg Oral PRN Park Pope, MD       magnesium hydroxide (MILK OF MAGNESIA) suspension 30 mL  30 mL Oral Daily PRN Park Pope, MD       methocarbamol (ROBAXIN) tablet 500 mg  500 mg Oral Q8H PRN Park Pope, MD       naproxen (NAPROSYN) tablet 500 mg  500 mg Oral BID PRN Park Pope, MD       nicotine (NICODERM CQ - dosed in mg/24 hours) patch 21 mg  21 mg Transdermal Daily Park Pope, MD   21 mg at 01/24/22 0920   nicotine polacrilex (NICORETTE) gum 2 mg  2 mg Oral PRN Park Pope, MD       ondansetron (ZOFRAN-ODT) disintegrating tablet 4 mg  4 mg Oral Q6H PRN Park Pope, MD       Current Outpatient Medications  Medication Sig Dispense Refill   diphenhydrAMINE (BENADRYL) 25 MG tablet Take 25 mg by mouth every 6 (six) hours as needed for allergies.     ibuprofen (ADVIL,MOTRIN) 200 MG  tablet Take 200 mg by mouth every 6 (six) hours as needed for mild pain or moderate pain.     PTA Medications: Prior to Admission medications   Medication Sig Start Date End Date Taking? Authorizing Provider  diphenhydrAMINE (BENADRYL) 25 MG tablet Take 25 mg by mouth every 6 (six) hours as needed for allergies.   Yes [provider]  ibuprofen (ADVIL,MOTRIN) 200 MG tablet Take 200 mg by mouth every 6 (six) hours as needed for mild pain or moderate pain.    [provider]    Patient Stressors: Substance abuse    Patient Strengths: Ability for insight  Average or above average intelligence  Capable of independent living  General fund of knowledge   Treatment Modalities: Medication Management, Group therapy, Case management,  1 to 1 session with clinician, Psychoeducation, Recreational therapy.   Physician Treatment Plan for Primary and Secondary Diagnosis:  Final diagnoses:  Opioid dependence with opioid-induced mood disorder (HCC)  Cocaine use disorder, severe, in controlled environment (HCC)   Long Term Goal(s): Improvement in symptoms so as ready for discharge  Short Term  Goals: Patient will verbalize feelings in meetings with treatment team members. Patient will attend at least of 50% of the groups daily. Pt will complete the PHQ9 on admission, day 3 and discharge. Patient will participate in completing the [place] Suicide Severity Rating Scale Patient will score a low risk of violence for 24 hours prior to discharge Patient will take medications as prescribed daily.  Medication Management: Evaluate patient's response, side effects, and tolerance of medication regimen.  Therapeutic Interventions: 1 to 1 sessions, Unit Group sessions and Medication administration.  Evaluation of Outcomes: Progressing  LCSW Treatment Plan for Primary Diagnosis:  Final diagnoses:  Opioid dependence with opioid-induced mood disorder (HCC)  Cocaine use disorder, severe, in  controlled environment [facility])    Long Term Goal(s): Safe transition to appropriate next level of care at discharge.  Short Term Goals: Facilitate acceptance of mental health diagnosis and concerns through verbal commitment to aftercare plan and appointments at discharge., Patient will identify one social support prior to discharge to aid in patient's recovery., Patient will attend AA/NA groups as scheduled., Identify minimum of 2 triggers associated with mental health/substance abuse issues with treatment team members., and Increase skills for wellness and recovery by attending 50% of scheduled groups.  Therapeutic Interventions: Assess for all discharge needs, 1 to 1 time with Child psychotherapist, Explore available resources and support systems, Assess for adequacy in community support network, Educate family and significant other(s) on suicide prevention, Complete Psychosocial Assessment, Interpersonal group therapy.  Evaluation of Outcomes: Progressing   Progress in Treatment: Attending groups: Yes. Participating in groups: Yes. Taking medication as prescribed: Yes. Toleration medication: Yes. Family/Significant other contact made: No, will contact:  Patient declined collateral at this time.  Patient understands diagnosis: Yes. Discussing patient identified problems/goals with staff: Yes. Medical problems stabilized or resolved: Yes. Denies suicidal/homicidal ideation: Yes. Issues/concerns per patient self-inventory: Yes. Other: substance use  New problem(s) identified: No, Describe:  other than reported on admission  New Short Term/Long Term Goal(s): Safe transition to appropriate next level of care at discharge, Engage patient in therapeutic group addressing interpersonal concerns. Engage patient in aftercare planning with referrals and resources, Increase ability to appropriately verbalize feelings, Facilitate acceptance of mental health diagnosis and concerns and Identify triggers  associated with mental health/substance abuse issues.   Patient Goals:  Patient reports interest in intensive outpatient treatment with [facility] in [place], [place]  Discharge Plan or Barriers: LCSW will follow up with agency and provide updates as received.   Reason for Continuation of Hospitalization: Other; describe stabilization   Estimated Length of Stay: 3-5 days  Last 3 [place] Suicide Severity Risk Score: Flowsheet Row ED from 01/23/2022 in [facility]  C-SSRS RISK CATEGORY No Risk       Last [facility] 2/9 Scores:    01/24/2022    8:29 AM 01/23/2022    3:17 PM  Depression screen PHQ 2/9  Decreased Interest 3 0  Down, Depressed, Hopeless 1 1  PHQ - 2 Score 4 1  Altered sleeping 3   Tired, decreased energy 3   Change in appetite 1   Moving slowly or fidgety/restless 0   Suicidal thoughts 0   PHQ-9 Score 11   Difficult doing work/chores Somewhat difficult     Scribe for Treatment Team: Lenny Pastel 01/24/2022 1:37 PM

## 2022-01-24 NOTE — ED Provider Notes (Signed)
Behavioral Health Progress Note  Date and Time: 01/24/2022 8:22 PM Name: Shelly George MRN:  [id number]  Subjective:  Shelly George is a 41 y.o. female w/ hx of opiate use disorder and stimulant use disorder, hypertension presenting to [facility] voluntarily requesting detox from suboxone and cocaine.    On assessment, patient reiterates that she would like assistance with inpatient substance use treatment. She says that she does not like the way that she feels using Suboxone, and she is ready to discontinue using. She has successfully attempted sobriety once before for one week; this was 4 years ago. Patient endorses depression and anxiety as primary withdrawal sx. She also endorses constipation. She denies myalgias, N/V/D. She denies SI, HI, AVH, paranoia. She has been sleeping fairly, and with intact appetite. She has no acute concerns or complaints today.    Diagnosis:  Final diagnoses:  Opioid dependence with opioid-induced mood disorder (HCC)  Cocaine use disorder, severe, in controlled environment ([place])    Total Time spent with patient: 30 minutes  Past Psychiatric History: See H&P Past Medical History:  Past Medical History:  Diagnosis Date   Anxiety    Drug abuse and dependence ([place])    opiates, suboxone   Seizure ([place])    No past surgical history on file. Family History:  Family History  Problem Relation Age of Onset   Cancer Mother    Family Psychiatric  History: See H&P Social History:  Social History   Substance and Sexual Activity  Alcohol Use Yes     Social History   Substance and Sexual Activity  Drug Use Yes   Types: Oxycodone   Comment: suboxene-street drug used 5 days ago    Social History   Socioeconomic History   Marital status: Legally Separated    Spouse name: Not on file   Number of children: Not on file   Years of education: Not on file   Highest education level: Not on file  Occupational History   Not on file  Tobacco Use   Smoking status:  Every Day    Packs/day: 0.50    Types: Cigarettes   Smokeless tobacco: Never  Substance and Sexual Activity   Alcohol use: Yes   Drug use: Yes    Types: Oxycodone    Comment: suboxene-street drug used 5 days ago   Sexual activity: Yes  Other Topics Concern   Not on file  Social History Narrative   Not on file   Social Determinants of Health   Financial Resource Strain: Not on file  Food Insecurity: Not on file  Transportation Needs: Not on file  Physical Activity: Not on file  Stress: Not on file  Social Connections: Not on file   SDOH:  SDOH Screenings   Depression (PHQ2-9): High Risk (01/24/2022)  Tobacco Use: High Risk (12/31/2018)   Additional Social History:    Pain Medications: See MAR  Prescriptions: See MAR  Over the Counter: See MAR  History of alcohol / drug use?: Yes Longest period of sobriety (when/how long): 6 months Negative Consequences of Use: Work / Youth worker, Charity fundraiser relationships, Scientist, research (physical sciences), Museum/gallery curator Withdrawal Symptoms: Other (Comment) Name of Substance 1: Cocaine 1 - Age of First Use: 41 years old 1 - Amount (size/oz): 8 ball or more per day 1 - Frequency: daily 1 - Duration: on-going 1 - Last Use / Amount: 45 minutes prior to arrival 1 - Method of Aquiring: varies 1- Route of Use: IV and smoking Name of Substance 2: She takes  unprescribed Suboxone x10 years (8 milligrams) per day. 2 - Age of First Use: 41 years old 2 - Amount (size/oz): 8 milligrams per day 2 - Frequency: daily 2 - Duration: 10 years 2 - Last Use / Amount: Yesterday 2 - Method of Aquiring: "I get it off the street" 2 - Route of Substance Use: Oral Name of Substance 3: She drinks alcohol (#1 4o ounce) daily to every other day. Last drink was last night (#32 ounce beer). 3 - Age of First Use: early 20's 3 - Amount (size/oz): #1 (40 ounce per day) 3 - Frequency: Daily 3 - Duration: On-going 3 - Last Use / Amount: #32 ounce beer 3 - Method of Aquiring: Store 3 - Route of  Substance Use: Oral              Sleep: Fair  Appetite:  Fair  Current Medications:  Current Facility-Administered Medications  Medication Dose Route Frequency Provider Last Rate Last Admin   acetaminophen (TYLENOL) tablet 650 mg  650 mg Oral Q6H PRN France Ravens, MD       alum & mag hydroxide-simeth (MAALOX/MYLANTA) 200-200-20 MG/5ML suspension 30 mL  30 mL Oral Q4H PRN France Ravens, MD       buPROPion (WELLBUTRIN XL) 24 hr tablet 150 mg  150 mg Oral Daily Rosezetta Schlatter, MD   150 mg at 01/24/22 1425   cloNIDine (CATAPRES) tablet 0.1 mg  0.1 mg Oral QID France Ravens, MD   0.1 mg at 01/24/22 1401   Followed by   Derrill Memo ON 01/26/2022] cloNIDine (CATAPRES) tablet 0.1 mg  0.1 mg Oral Shearon Stalls, MD       Followed by   Derrill Memo ON 01/28/2022] cloNIDine (CATAPRES) tablet 0.1 mg  0.1 mg Oral QAC breakfast France Ravens, MD       dicyclomine (BENTYL) tablet 20 mg  20 mg Oral Q6H PRN France Ravens, MD       hydrOXYzine (ATARAX) tablet 25 mg  25 mg Oral Q6H PRN France Ravens, MD       loperamide (IMODIUM) capsule 2-4 mg  2-4 mg Oral PRN France Ravens, MD       magnesium hydroxide (MILK OF MAGNESIA) suspension 30 mL  30 mL Oral Daily PRN France Ravens, MD       methocarbamol (ROBAXIN) tablet 500 mg  500 mg Oral Q8H PRN France Ravens, MD       naproxen (NAPROSYN) tablet 500 mg  500 mg Oral BID PRN France Ravens, MD       nicotine (NICODERM CQ - dosed in mg/24 hours) patch 21 mg  21 mg Transdermal Daily France Ravens, MD   21 mg at 01/24/22 0920   nicotine polacrilex (NICORETTE) gum 2 mg  2 mg Oral PRN France Ravens, MD       ondansetron (ZOFRAN-ODT) disintegrating tablet 4 mg  4 mg Oral Q6H PRN France Ravens, MD       Current Outpatient Medications  Medication Sig Dispense Refill   diphenhydrAMINE (BENADRYL) 25 MG tablet Take 25 mg by mouth every 6 (six) hours as needed for allergies.     ibuprofen (ADVIL,MOTRIN) 200 MG tablet Take 200 mg by mouth every 6 (six) hours as needed for mild pain or moderate pain.       Labs  Lab Results:  Admission on 01/23/2022  Component Date Value Ref Range Status   SARS Coronavirus 2 by RT PCR 01/23/2022 NEGATIVE  NEGATIVE Final   Comment: (NOTE) SARS-CoV-2 target nucleic acids  are NOT DETECTED.  The SARS-CoV-2 RNA is generally detectable in upper respiratory specimens during the acute phase of infection. The lowest concentration of SARS-CoV-2 viral copies this assay can detect is 138 copies/mL. A negative result does not preclude SARS-Cov-2 infection and should not be used as the sole basis for treatment or other patient management decisions. A negative result may occur with  improper specimen collection/handling, submission of specimen other than nasopharyngeal swab, presence of viral mutation(s) within the areas targeted by this assay, and inadequate number of viral copies(<138 copies/mL). A negative result must be combined with clinical observations, patient history, and epidemiological information. The expected result is Negative.  Fact Sheet for Patients:  EntrepreneurPulse.com.au  Fact Sheet for Healthcare Providers:  IncredibleEmployment.be  This test is no                          t yet approved or cleared by the [place] FDA and  has been authorized for detection and/or diagnosis of SARS-CoV-2 by FDA under an Emergency Use Authorization (EUA). This EUA will remain  in effect (meaning this test can be used) for the duration of the COVID-19 declaration under Section 564(b)(1) of the Act, 21 U.S.C.section 360bbb-3(b)(1), unless the authorization is terminated  or revoked sooner.       Influenza A by PCR 01/23/2022 NEGATIVE  NEGATIVE Final   Influenza B by PCR 01/23/2022 NEGATIVE  NEGATIVE Final   Comment: (NOTE) The Xpert Xpress SARS-CoV-2/FLU/RSV plus assay is intended as an aid in the diagnosis of influenza from Nasopharyngeal swab specimens and should not be used as a sole basis for treatment.  Nasal washings and aspirates are unacceptable for Xpert Xpress SARS-CoV-2/FLU/RSV testing.  Fact Sheet for Patients: EntrepreneurPulse.com.au  Fact Sheet for Healthcare Providers: IncredibleEmployment.be  This test is not yet approved or cleared by the [place] FDA and has been authorized for detection and/or diagnosis of SARS-CoV-2 by FDA under an Emergency Use Authorization (EUA). This EUA will remain in effect (meaning this test can be used) for the duration of the COVID-19 declaration under Section 564(b)(1) of the Act, 21 U.S.C. section 360bbb-3(b)(1), unless the authorization is terminated or revoked.  Performed at [facility], [place] [address], [place], [place] [address]    WBC 01/23/2022 6.2  4.0 - 10.5 K/uL Final   RBC 01/23/2022 4.63  3.87 - 5.11 MIL/uL Final   Hemoglobin 01/23/2022 13.7  12.0 - 15.0 g/dL Final   HCT 01/23/2022 40.2  36.0 - 46.0 % Final   MCV 01/23/2022 86.8  80.0 - 100.0 fL Final   MCH 01/23/2022 29.6  26.0 - 34.0 pg Final   MCHC 01/23/2022 34.1  30.0 - 36.0 g/dL Final   RDW 01/23/2022 13.9  11.5 - 15.5 % Final   Platelets 01/23/2022 191  150 - 400 K/uL Final   nRBC 01/23/2022 0.0  0.0 - 0.2 % Final   Neutrophils Relative % 01/23/2022 42  % Final   Neutro Abs 01/23/2022 2.6  1.7 - 7.7 K/uL Final   Lymphocytes Relative 01/23/2022 47  % Final   Lymphs Abs 01/23/2022 2.9  0.7 - 4.0 K/uL Final   Monocytes Relative 01/23/2022 8  % Final   Monocytes Absolute 01/23/2022 0.5  0.1 - 1.0 K/uL Final   Eosinophils Relative 01/23/2022 2  % Final   Eosinophils Absolute 01/23/2022 0.1  0.0 - 0.5 K/uL Final   Basophils Relative 01/23/2022 1  % Final   Basophils  Absolute 01/23/2022 0.0  0.0 - 0.1 K/uL Final   Immature Granulocytes 01/23/2022 0  % Final   Abs Immature Granulocytes 01/23/2022 0.01  0.00 - 0.07 K/uL Final   Performed at [facility], [address], [place], [place] [address]   Sodium 01/23/2022  139  135 - 145 mmol/L Final   Potassium 01/23/2022 3.6  3.5 - 5.1 mmol/L Final   Chloride 01/23/2022 107  98 - 111 mmol/L Final   CO2 01/23/2022 26  22 - 32 mmol/L Final   Glucose, Bld 01/23/2022 94  70 - 99 mg/dL Final   Glucose reference range applies only to samples taken after fasting for at least 8 hours.   BUN 01/23/2022 10  6 - 20 mg/dL Final   Creatinine, Ser 01/23/2022 0.64  0.44 - 1.00 mg/dL Final   Calcium 26/94/8546 9.1  8.9 - 10.3 mg/dL Final   Total Protein 27/04/5007 7.4  6.5 - 8.1 g/dL Final   Albumin 38/18/2993 3.8  3.5 - 5.0 g/dL Final   AST 71/69/6789 33  15 - 41 U/L Final   ALT 01/23/2022 35  0 - 44 U/L Final   Alkaline Phosphatase 01/23/2022 73  38 - 126 U/L Final   Total Bilirubin 01/23/2022 0.4  0.3 - 1.2 mg/dL Final   GFR, Estimated 01/23/2022 >60  >60 mL/min Final   Comment: (NOTE) Calculated using the CKD-EPI Creatinine Equation (2021)    Anion gap 01/23/2022 6  5 - 15 Final   Performed at [facility], [address] [place], [place] [address]   Hgb A1c MFr Bld 01/23/2022 5.4  4.8 - 5.6 % Final   Comment: (NOTE)         Prediabetes: 5.7 - 6.4         Diabetes: >6.4         Glycemic control for adults with diabetes: <7.0    Mean Plasma Glucose 01/23/2022 108  mg/dL Final   Comment: (NOTE) Performed At: [facility] [address] [place], [place] [id number] Jolene Schimke MD DP:[id number]    Alcohol, Ethyl (B) 01/23/2022 <10  <10 mg/dL Final   Comment: (NOTE) Lowest detectable limit for serum alcohol is 10 mg/dL.  For medical purposes only. Performed at [facility], [address] [place], [place] [address]    Cholesterol 01/23/2022 194  0 - 200 mg/dL Final   Triglycerides 00/86/7619 145  <150 mg/dL Final   HDL 50/93/2671 36 (L)  >40 mg/dL Final   Total CHOL/HDL Ratio 01/23/2022 5.4  RATIO Final   VLDL 01/23/2022 29  0 - 40 mg/dL Final   LDL Cholesterol 01/23/2022 129 (H)  0 - 99 mg/dL Final   Comment:        Total  Cholesterol/HDL:CHD Risk Coronary Heart Disease Risk Table                     Men   Women  1/2 Average Risk   3.4   3.3  Average Risk       5.0   4.4  2 X Average Risk   9.6   7.1  3 X Average Risk  23.4   11.0        Use the calculated Patient Ratio above and the CHD Risk Table to determine the patient's CHD Risk.        ATP III CLASSIFICATION (LDL):  <100     mg/dL   Optimal  245-809  mg/dL  Near or Above                    Optimal  130-159  mg/dL   Borderline  160-189  mg/dL   High  >190     mg/dL   Very High Performed at [place] [address] [place], [place] [address]    TSH 01/23/2022 0.649  0.350 - 4.500 uIU/mL Final   Comment: Performed by a 3rd Generation assay with a functional sensitivity of <=0.01 uIU/mL. Performed at [facility], [place] [address] [place], [place] [address]    Hepatitis B Surface Ag 01/23/2022 NON REACTIVE  NON REACTIVE Final   HCV Ab 01/23/2022 Reactive (A)  NON REACTIVE Final   Comment: (NOTE) The CDC recommends that a Reactive HCV antibody result be followed up  with a HCV Nucleic Acid Amplification test.     Hep A IgM 01/23/2022 NON REACTIVE  NON REACTIVE Final   Hep B C IgM 01/23/2022 NON REACTIVE  NON REACTIVE Final   Performed at [facility], [place] [address] [place], [place] [address]   RPR Ser Ql 01/23/2022 NON REACTIVE  NON REACTIVE Final   Performed at [facility], [place] [address] [place], [place] [address]   HIV Screen 4th Generation wRfx 01/23/2022 Non Reactive  Non Reactive Final   Performed at [facility], [place] [address], [place], [place] [address]   Preg Test, Ur 01/24/2022 Negative  Negative Final   Color, Urine 01/24/2022 AMBER (A)  YELLOW Final   BIOCHEMICALS MAY BE AFFECTED BY COLOR   APPearance 01/24/2022 HAZY (A)  CLEAR Final   Specific Gravity, Urine 01/24/2022 1.030  1.005 - 1.030 Final   pH 01/24/2022 5.0  5.0 - 8.0 Final   Glucose, UA 01/24/2022 NEGATIVE  NEGATIVE mg/dL Final    Hgb urine dipstick 01/24/2022 NEGATIVE  NEGATIVE Final   Bilirubin Urine 01/24/2022 NEGATIVE  NEGATIVE Final   Ketones, ur 01/24/2022 NEGATIVE  NEGATIVE mg/dL Final   Protein, ur 01/24/2022 NEGATIVE  NEGATIVE mg/dL Final   Nitrite 01/24/2022 NEGATIVE  NEGATIVE Final   Leukocytes,Ua 01/24/2022 NEGATIVE  NEGATIVE Final   Performed at [place] [address] [place], [place] [address]   POC Amphetamine UR 01/24/2022 None Detected  NONE DETECTED (Cut Off Level 1000 ng/mL) Final   POC Secobarbital (BAR) 01/24/2022 None Detected  NONE DETECTED (Cut Off Level 300 ng/mL) Final   POC Buprenorphine (BUP) 01/24/2022 None Detected  NONE DETECTED (Cut Off Level 10 ng/mL) Final   POC Oxazepam (BZO) 01/24/2022 Positive (A)  NONE DETECTED (Cut Off Level 300 ng/mL) Final   POC Cocaine UR 01/24/2022 Positive (A)  NONE DETECTED (Cut Off Level 300 ng/mL) Final   POC Methamphetamine UR 01/24/2022 None Detected  NONE DETECTED (Cut Off Level 1000 ng/mL) Final   POC Morphine 01/24/2022 None Detected  NONE DETECTED (Cut Off Level 300 ng/mL) Final   POC Methadone UR 01/24/2022 None Detected  NONE DETECTED (Cut Off Level 300 ng/mL) Final   POC Oxycodone UR 01/24/2022 None Detected  NONE DETECTED (Cut Off Level 100 ng/mL) Final   POC Marijuana UR 01/24/2022 Positive (A)  NONE DETECTED (Cut Off Level 50 ng/mL) Final   SARSCOV2ONAVIRUS 2 AG 01/23/2022 NEGATIVE  NEGATIVE Final   Comment: (NOTE) SARS-CoV-2 antigen NOT DETECTED.   Negative results are presumptive.  Negative results do not preclude SARS-CoV-2 infection and should not be used as the sole basis for  treatment or other patient management decisions, including infection  control decisions, particularly in the presence of clinical signs and  symptoms consistent with COVID-19, or in those who have been in contact with the virus.  Negative results must be combined with clinical observations, patient history, and epidemiological information. The  expected result is Negative.  Fact Sheet for Patients: https://www.jennings-kim.com/https://www.fda.gov/media/141569/download  Fact Sheet for Healthcare Providers: https://alexander-rogers.biz/https://www.fda.gov/media/141568/download  This test is not yet approved or cleared by the [place] FDA and  has been authorized for detection and/or diagnosis of SARS-CoV-2 by FDA under an Emergency Use Authorization (EUA).  This EUA will remain in effect (meaning this test can be used) for the duration of  the COV                          ID-19 declaration under Section 564(b)(1) of the Act, 21 U.S.C. section 360bbb-3(b)(1), unless the authorization is terminated or revoked sooner.     Preg Test, Ur 01/24/2022 NEGATIVE  NEGATIVE Final   Comment:        THE SENSITIVITY OF THIS METHODOLOGY IS >24 mIU/mL     Blood Alcohol level:  Lab Results  Component Value Date   ETH <10 01/23/2022   ETH <5 02/22/2014    Metabolic Disorder Labs: Lab Results  Component Value Date   HGBA1C 5.4 01/23/2022   MPG 108 01/23/2022   No results found for: "PROLACTIN" Lab Results  Component Value Date   CHOL 194 01/23/2022   TRIG 145 01/23/2022   HDL 36 (L) 01/23/2022   CHOLHDL 5.4 01/23/2022   VLDL 29 01/23/2022   LDLCALC 129 (H) 01/23/2022    Therapeutic Lab Levels: No results found for: "LITHIUM" Lab Results  Component Value Date   VALPROATE <10.0 (L) 12/31/2006   No results found for: "CBMZ"  Physical Findings   PHQ2-9    [facility]  PHQ-2 Total Score 4  PHQ-9 Total Score 11      [facility]  C-SSRS RISK CATEGORY No Risk        Musculoskeletal  Strength & Muscle Tone: within normal limits Gait & Station: normal Patient leans: N/A  Psychiatric Specialty Exam  Presentation  General Appearance:  Appropriate for Environment; Casual  Eye Contact: Good  Speech: Clear and Coherent; Normal  Rate  Speech Volume: Normal  Handedness:No data recorded  Mood and Affect  Mood: Anxious; Euphoric  Affect: Labile   Thought Process  Thought Processes: Coherent; Goal Directed; Linear  Descriptions of Associations:Intact  Orientation:Full (Time, Place and Person)  Thought Content:Logical  Diagnosis of Schizophrenia or Schizoaffective disorder in past: No    Hallucinations:Hallucinations: None  Ideas of Reference:None  Suicidal Thoughts:Suicidal Thoughts: No  Homicidal Thoughts:Homicidal Thoughts: No   Sensorium  Memory: Immediate Good; Recent Good; Remote Good  Judgment: Impaired  Insight: Fair   Art therapistxecutive Functions  Concentration: Poor  Attention Span: Fair  Recall: FiservFair  Fund of Knowledge: Fair  Language: Fair   Psychomotor Activity  Psychomotor Activity: Psychomotor Activity: Increased   Assets  Assets: Communication Skills; Desire for Improvement; Leisure Time; Physical Health; Social Support   Sleep  Sleep: Sleep: Poor   Nutritional Assessment (For OBS and FBC admissions only) Has the patient had a weight loss or gain of 10 pounds or more in the last 3 months?: No Has the patient had a decrease in food  intake/or appetite?: Yes Does the patient have dental problems?: No Does the patient have eating habits or behaviors that may be indicators of an eating disorder including binging or inducing vomiting?: No Has the patient recently lost weight without trying?: 0 Has the patient been eating poorly because of a decreased appetite?: 0 Malnutrition Screening Tool Score: 0    Physical Exam  Physical Exam Vitals reviewed.  Constitutional:      General: She is not in acute distress. HENT:     Head: Normocephalic and atraumatic.     Mouth/Throat:     Mouth: Mucous membranes are moist.     Pharynx: Oropharynx is clear.  Pulmonary:     Effort: Pulmonary effort is normal.  Skin:    General: Skin is warm and dry.   Neurological:     General: No focal deficit present.     Mental Status: She is alert.     Gait: Gait normal.    Review of Systems  Constitutional:  Positive for malaise/fatigue. Negative for chills and diaphoresis.  Cardiovascular:  Negative for chest pain and palpitations.  Gastrointestinal:  Positive for constipation. Negative for diarrhea, nausea and vomiting.  Genitourinary: Negative.   Musculoskeletal:  Negative for myalgias.  Neurological:  Negative for weakness and headaches.   Blood pressure 94/63, pulse (!) 57, temperature 97.9 F (36.6 C), temperature source Oral, resp. rate 18, SpO2 99 %. There is no height or weight on file to calculate BMI.  Treatment Plan Summary: Daily contact with patient to assess and evaluate symptoms and progress in treatment and Medication management  #Opioid use disorder #Stimulant Use Disorder -Continue COWS monitoring -Continue 0.1 mg Clonidine taper for withdrawal symptoms -Continue Bentyl 20 mg every 6H PRN for  spasm, abdominal cramping x5 days -Continue Robaxin 500 mg every 8 hours as needed for muscle spasm x5 days -Continue naproxen 500 mg twice daily as needed for aching, pain or discomfort x5 days. -Continue Zofran 4 mg every 6 hours as needed for nausea or vomiting. -Continue Imodium 2 to 4 mg as needed for diarrhea or loose stools for 72 hours.  -Continue hydroxyzine 25 mg every 6 hours as needed for anxiety   #Nicotine Use Disorder -NRT  #MDD -Start Wellbutrin XL 150 mg daily. Also beneficial for patient's history of ADHD but primary complaint of decreased energy.   Dispo: Pending  Rosezetta Schlatter, MD 01/24/2022 8:22 PM

## 2022-01-24 NOTE — ED Notes (Signed)
Pt is in the bed sleeping. Respirations are even and unlabored. No acute distress noted. Will continue to monitor for safety. 

## 2022-01-24 NOTE — ED Notes (Signed)
Patient observed/assessed in room lying in personal bed. Patient alert and oriented x 4. Affect is flat. Patient denies pain and anxiety. Patient denies A/V/H. He denies having any thoughts/plan of self harm and harm towards others. Fluid and snack offered. Patient states that appetite has been good throughout the day. Verbalizes no further complaints at this time. Will continue to monitor and support.

## 2022-01-24 NOTE — ED Notes (Signed)
Patient A&Ox4. Denies intent to harm self/others when asked. Denies A/VH. Patient denies any physical complaints when asked. No acute distress noted. Support and encouragement provided. Routine safety checks conducted according to facility protocol. Encouraged patient to notify staff if thoughts of harm toward self or others arise. Patient verbalize understanding and agreement. Will continue to monitor for safety.    

## 2022-01-24 NOTE — Group Note (Signed)
Group Topic: Balance in Life  Group Date: 01/24/2022 Start Time: 1000 End Time: 1059 Facilitators: Alexandria Lodge D, NT  Department: [facility]  Number of Participants: 3  Group Focus: affirmation, daily focus, and self-esteem Treatment Modality:  Skills Training Interventions utilized were clarification, exploration, group exercise, and story telling Purpose: enhance coping skills, express feelings, and increase insight  Name: TAURUS WILLIS Date of Birth: [birth year]  MR: [id number]    Level of Participation: moderate Quality of Participation: attentive and engaged Interactions with others: gave feedback Mood/Affect: anxious and appropriate Triggers (if applicable): This is all new and she was kinda confused.  Cognition: concrete and insightful Progress: Moderate Response: We are here to help  you with a safety plan and to be a better you.  Plan: patient will be encouraged to Set small goals, get positive coping skills.   Patients Problems:  Patient Active Problem List   Diagnosis Date Noted   Opiate addiction (HCC) 01/23/2022   Polysubstance abuse (HCC)

## 2022-01-24 NOTE — ED Notes (Signed)
Pt sitting in dayroom interacting with peers and watching tv. Denies discomfort at present. Denies needs or concerns. Informed pt to notify staff with any assistance. Will continue to monitor for safety.

## 2022-01-24 NOTE — ED Notes (Signed)
Patient observed/assessed in room in bed appearing in no immediate distress resting peacefully. Q15 minute checks continued by MHT and nursing staff. Will continue to monitor and support. 

## 2022-01-25 DIAGNOSIS — F1124 Opioid dependence with opioid-induced mood disorder: Secondary | ICD-10-CM | POA: Diagnosis not present

## 2022-01-25 LAB — GC/CHLAMYDIA PROBE AMP (~~LOC~~) NOT AT ARMC
Chlamydia: NEGATIVE
Comment: NEGATIVE
Comment: NORMAL
Neisseria Gonorrhea: NEGATIVE

## 2022-01-25 LAB — HCV RNA, QUANTITATIVE REFLEX
HCV RNA - Quantitation: 9270000 IU/mL
HCV RNA - log10: 6.967 log10 IU/mL

## 2022-01-25 LAB — HCV RNA DIAGNOSIS, NAA

## 2022-01-25 NOTE — ED Notes (Signed)
Patient woke up complaining of back pain. PRN Naproxen was given PRN. Patient received and returned to bed. Will continue to monitor.

## 2022-01-25 NOTE — Discharge Planning (Signed)
Patient has been referred to the following residential facilities for review:   [facility]: PHP with housing; Interview on Monday 12/11 at 9:00am. Agency will call unit phone to complete phone screening with patient.  [facility]: Currently under review. Agency will follow up to provide updates.  [facility]: Patient is currently completing phone screening with Admissions Coordinator [name]. Updates to be provided as received.  [facility]: Phone screening with Representative Eric at 2:30pm on today. Number to call in is [phone] extension 2. Updates will be provided as received.   LCSW will continue to follow and provide support to patient while in [facility].   [name], LCSW Clinical Social Worker [place] Ph: [phone]

## 2022-01-25 NOTE — ED Provider Notes (Cosign Needed Addendum)
Behavioral Health Progress Note  Date and Time: 01/25/2022 4:25 PM Name: Shelly George MRN:  [id number]  Subjective:  Shelly George is a 41 y.o. female w/ hx of opiate use disorder and stimulant use disorder, hypertension presenting to [facility] voluntarily requesting detox from suboxone and cocaine.    On assessment, patient remains motivated for inpatient substance use treatment. She denies withdrawal sx at this time, believing that they will begin tomorrow for her, as that will be three days beyond last use.  Patient otherwise reports that her mood is good. She denies SI, HI, AVH, paranoia. She has been sleeping fairly, and with intact appetite. She has no acute concerns or complaints today.    Patient was accepted to [facility] with plan to discharge tomorrow.   Diagnosis:  Final diagnoses:  Opioid dependence with opioid-induced mood disorder (HCC)  Cocaine use disorder, severe, in controlled environment (HCC)    Total Time spent with patient: 20 minutes  Past Psychiatric History: See H&P Past Medical History:  Past Medical History:  Diagnosis Date   Anxiety    Drug abuse and dependence (HCC)    opiates, suboxone   Seizure (HCC)    No past surgical history on file. Family History:  Family History  Problem Relation Age of Onset   Cancer Mother    Family Psychiatric  History: See H&P Social History:  Social History   Substance and Sexual Activity  Alcohol Use Yes     Social History   Substance and Sexual Activity  Drug Use Yes   Types: Oxycodone   Comment: suboxene-street drug used 5 days ago    Social History   Socioeconomic History   Marital status: Legally Separated    Spouse name: Not on file   Number of children: Not on file   Years of education: Not on file   Highest education level: Not on file  Occupational History   Not on file  Tobacco Use   Smoking status: Every Day    Packs/day: 0.50    Types: Cigarettes   Smokeless tobacco: Never   Substance and Sexual Activity   Alcohol use: Yes   Drug use: Yes    Types: Oxycodone    Comment: suboxene-street drug used 5 days ago   Sexual activity: Yes  Other Topics Concern   Not on file  Social History Narrative   Not on file   Social Determinants of Health   Financial Resource Strain: Not on file  Food Insecurity: Not on file  Transportation Needs: Not on file  Physical Activity: Not on file  Stress: Not on file  Social Connections: Not on file   SDOH:  SDOH Screenings   Depression (PHQ2-9): High Risk (01/24/2022)  Tobacco Use: High Risk (12/31/2018)   Additional Social History:    Pain Medications: See MAR  Prescriptions: See MAR  Over the Counter: See MAR  History of alcohol / drug use?: Yes Longest period of sobriety (when/how long): 6 months Negative Consequences of Use: Work / Programmer, multimedia, Copywriter, advertising relationships, Armed forces operational officer, Surveyor, quantity Withdrawal Symptoms: Other (Comment) Name of Substance 1: Cocaine 1 - Age of First Use: 41 years old 1 - Amount (size/oz): 8 ball or more per day 1 - Frequency: daily 1 - Duration: on-going 1 - Last Use / Amount: 45 minutes prior to arrival 1 - Method of Aquiring: varies 1- Route of Use: IV and smoking Name of Substance 2: She takes unprescribed Suboxone x10 years (8 milligrams) per day. 2 -  Age of First Use: 41 years old 2 - Amount (size/oz): 8 milligrams per day 2 - Frequency: daily 2 - Duration: 10 years 2 - Last Use / Amount: Yesterday 2 - Method of Aquiring: "I get it off the street" 2 - Route of Substance Use: Oral Name of Substance 3: She drinks alcohol (#1 4o ounce) daily to every other day. Last drink was last night (#32 ounce beer). 3 - Age of First Use: early 20's 3 - Amount (size/oz): #1 (40 ounce per day) 3 - Frequency: Daily 3 - Duration: On-going 3 - Last Use / Amount: #32 ounce beer 3 - Method of Aquiring: Store 3 - Route of Substance Use: Oral              Sleep: Fair  Appetite:  Fair  Current  Medications:  Current Facility-Administered Medications  Medication Dose Route Frequency Provider Last Rate Last Admin   acetaminophen (TYLENOL) tablet 650 mg  650 mg Oral Q6H PRN Park Pope, MD       alum & mag hydroxide-simeth (MAALOX/MYLANTA) 200-200-20 MG/5ML suspension 30 mL  30 mL Oral Q4H PRN Park Pope, MD       buPROPion (WELLBUTRIN XL) 24 hr tablet 150 mg  150 mg Oral Daily Lamar Sprinkles, MD   150 mg at 01/25/22 1028   cloNIDine (CATAPRES) tablet 0.1 mg  0.1 mg Oral QID Park Pope, MD   0.1 mg at 01/25/22 1228   Followed by   Melene Muller ON 01/26/2022] cloNIDine (CATAPRES) tablet 0.1 mg  0.1 mg Oral Sabino Snipes, MD       Followed by   Melene Muller ON 01/28/2022] cloNIDine (CATAPRES) tablet 0.1 mg  0.1 mg Oral QAC breakfast Park Pope, MD       dicyclomine (BENTYL) tablet 20 mg  20 mg Oral Q6H PRN Park Pope, MD       hydrOXYzine (ATARAX) tablet 25 mg  25 mg Oral Q6H PRN Park Pope, MD       loperamide (IMODIUM) capsule 2-4 mg  2-4 mg Oral PRN Park Pope, MD       magnesium hydroxide (MILK OF MAGNESIA) suspension 30 mL  30 mL Oral Daily PRN Park Pope, MD       methocarbamol (ROBAXIN) tablet 500 mg  500 mg Oral Q8H PRN Park Pope, MD       naproxen (NAPROSYN) tablet 500 mg  500 mg Oral BID PRN Park Pope, MD   500 mg at 01/25/22 0304   nicotine (NICODERM CQ - dosed in mg/24 hours) patch 21 mg  21 mg Transdermal Daily Park Pope, MD   21 mg at 01/25/22 1028   nicotine polacrilex (NICORETTE) gum 2 mg  2 mg Oral PRN Park Pope, MD       ondansetron (ZOFRAN-ODT) disintegrating tablet 4 mg  4 mg Oral Q6H PRN Park Pope, MD       Current Outpatient Medications  Medication Sig Dispense Refill   diphenhydrAMINE (BENADRYL) 25 MG tablet Take 25 mg by mouth every 6 (six) hours as needed for allergies.     ibuprofen (ADVIL,MOTRIN) 200 MG tablet Take 200 mg by mouth every 6 (six) hours as needed for mild pain or moderate pain.      Labs  Lab Results:  Admission on 01/23/2022  Component Date  Value Ref Range Status   SARS Coronavirus 2 by RT PCR 01/23/2022 NEGATIVE  NEGATIVE Final   Comment: (NOTE) SARS-CoV-2 target nucleic acids are NOT DETECTED.  The SARS-CoV-2 RNA  is generally detectable in upper respiratory specimens during the acute phase of infection. The lowest concentration of SARS-CoV-2 viral copies this assay can detect is 138 copies/mL. A negative result does not preclude SARS-Cov-2 infection and should not be used as the sole basis for treatment or other patient management decisions. A negative result may occur with  improper specimen collection/handling, submission of specimen other than nasopharyngeal swab, presence of viral mutation(s) within the areas targeted by this assay, and inadequate number of viral copies(<138 copies/mL). A negative result must be combined with clinical observations, patient history, and epidemiological information. The expected result is Negative.  Fact Sheet for Patients:  BloggerCourse.com  Fact Sheet for Healthcare Providers:  SeriousBroker.it  This test is no                          t yet approved or cleared by the [place] FDA and  has been authorized for detection and/or diagnosis of SARS-CoV-2 by FDA under an Emergency Use Authorization (EUA). This EUA will remain  in effect (meaning this test can be used) for the duration of the COVID-19 declaration under Section 564(b)(1) of the Act, 21 U.S.C.section 360bbb-3(b)(1), unless the authorization is terminated  or revoked sooner.       Influenza A by PCR 01/23/2022 NEGATIVE  NEGATIVE Final   Influenza B by PCR 01/23/2022 NEGATIVE  NEGATIVE Final   Comment: (NOTE) The Xpert Xpress SARS-CoV-2/FLU/RSV plus assay is intended as an aid in the diagnosis of influenza from Nasopharyngeal swab specimens and should not be used as a sole basis for treatment. Nasal washings and aspirates are unacceptable for Xpert Xpress  SARS-CoV-2/FLU/RSV testing.  Fact Sheet for Patients: BloggerCourse.com  Fact Sheet for Healthcare Providers: SeriousBroker.it  This test is not yet approved or cleared by the [place] FDA and has been authorized for detection and/or diagnosis of SARS-CoV-2 by FDA under an Emergency Use Authorization (EUA). This EUA will remain in effect (meaning this test can be used) for the duration of the COVID-19 declaration under Section 564(b)(1) of the Act, 21 U.S.C. section 360bbb-3(b)(1), unless the authorization is terminated or revoked.  Performed at [facility], [address] [place], [place] [address]    WBC 01/23/2022 6.2  4.0 - 10.5 K/uL Final   RBC 01/23/2022 4.63  3.87 - 5.11 MIL/uL Final   Hemoglobin 01/23/2022 13.7  12.0 - 15.0 g/dL Final   HCT 60/45/4098 40.2  36.0 - 46.0 % Final   MCV 01/23/2022 86.8  80.0 - 100.0 fL Final   MCH 01/23/2022 29.6  26.0 - 34.0 pg Final   MCHC 01/23/2022 34.1  30.0 - 36.0 g/dL Final   RDW 11/91/4782 13.9  11.5 - 15.5 % Final   Platelets 01/23/2022 191  150 - 400 K/uL Final   nRBC 01/23/2022 0.0  0.0 - 0.2 % Final   Neutrophils Relative % 01/23/2022 42  % Final   Neutro Abs 01/23/2022 2.6  1.7 - 7.7 K/uL Final   Lymphocytes Relative 01/23/2022 47  % Final   Lymphs Abs 01/23/2022 2.9  0.7 - 4.0 K/uL Final   Monocytes Relative 01/23/2022 8  % Final   Monocytes Absolute 01/23/2022 0.5  0.1 - 1.0 K/uL Final   Eosinophils Relative 01/23/2022 2  % Final   Eosinophils Absolute 01/23/2022 0.1  0.0 - 0.5 K/uL Final   Basophils Relative 01/23/2022 1  % Final   Basophils Absolute 01/23/2022 0.0  0.0 - 0.1  K/uL Final   Immature Granulocytes 01/23/2022 0  % Final   Abs Immature Granulocytes 01/23/2022 0.01  0.00 - 0.07 K/uL Final   Performed at [facility], [address] [place], [place] [address]   Sodium 01/23/2022 139  135 - 145 mmol/L Final   Potassium 01/23/2022 3.6  3.5 - 5.1  mmol/L Final   Chloride 01/23/2022 107  98 - 111 mmol/L Final   CO2 01/23/2022 26  22 - 32 mmol/L Final   Glucose, Bld 01/23/2022 94  70 - 99 mg/dL Final   Glucose reference range applies only to samples taken after fasting for at least 8 hours.   BUN 01/23/2022 10  6 - 20 mg/dL Final   Creatinine, Ser 01/23/2022 0.64  0.44 - 1.00 mg/dL Final   Calcium 19/14/7829 9.1  8.9 - 10.3 mg/dL Final   Total Protein 56/21/3086 7.4  6.5 - 8.1 g/dL Final   Albumin 57/84/6962 3.8  3.5 - 5.0 g/dL Final   AST 95/28/4132 33  15 - 41 U/L Final   ALT 01/23/2022 35  0 - 44 U/L Final   Alkaline Phosphatase 01/23/2022 73  38 - 126 U/L Final   Total Bilirubin 01/23/2022 0.4  0.3 - 1.2 mg/dL Final   GFR, Estimated 01/23/2022 >60  >60 mL/min Final   Comment: (NOTE) Calculated using the CKD-EPI Creatinine Equation (2021)    Anion gap 01/23/2022 6  5 - 15 Final   Performed at [facility], [address] [place], [place] [address]   Hgb A1c MFr Bld 01/23/2022 5.4  4.8 - 5.6 % Final   Comment: (NOTE)         Prediabetes: 5.7 - 6.4         Diabetes: >6.4         Glycemic control for adults with diabetes: <7.0    Mean Plasma Glucose 01/23/2022 108  mg/dL Final   Comment: (NOTE) Performed At: [facility] [address] [place], [place] [id number] Jolene Schimke MD IH:[id number]    Alcohol, Ethyl (B) 01/23/2022 <10  <10 mg/dL Final   Comment: (NOTE) Lowest detectable limit for serum alcohol is 10 mg/dL.  For medical purposes only. Performed at [facility], [address] [place], [place] [address]    Cholesterol 01/23/2022 194  0 - 200 mg/dL Final   Triglycerides 32/95/1884 145  <150 mg/dL Final   HDL 16/60/6301 36 (L)  >40 mg/dL Final   Total CHOL/HDL Ratio 01/23/2022 5.4  RATIO Final   VLDL 01/23/2022 29  0 - 40 mg/dL Final   LDL Cholesterol 01/23/2022 129 (H)  0 - 99 mg/dL Final   Comment:        Total Cholesterol/HDL:CHD Risk Coronary Heart Disease Risk Table                      Men   Women  1/2 Average Risk   3.4   3.3  Average Risk       5.0   4.4  2 X Average Risk   9.6   7.1  3 X Average Risk  23.4   11.0        Use the calculated Patient Ratio above and the CHD Risk Table to determine the patient's CHD Risk.        ATP III CLASSIFICATION (LDL):  <100     mg/dL   Optimal  601-093  mg/dL   Near or Above  Optimal  130-159  mg/dL   Borderline  401-027  mg/dL   High  >253     mg/dL   Very High Performed at [facility], [address] [place], [place] [address]    Neisseria Gonorrhea 01/24/2022 Negative   Final   Chlamydia 01/24/2022 Negative   Final   Comment 01/24/2022 Normal Reference Ranger Chlamydia - Negative   Final   Comment 01/24/2022 Normal Reference Range Neisseria Gonorrhea - Negative   Final   TSH 01/23/2022 0.649  0.350 - 4.500 uIU/mL Final   Comment: Performed by a 3rd Generation assay with a functional sensitivity of <=0.01 uIU/mL. Performed at [facility], [address], [place], [place] [address]    Hepatitis B Surface Ag 01/23/2022 NON REACTIVE  NON REACTIVE Final   HCV Ab 01/23/2022 Reactive (A)  NON REACTIVE Final   Comment: (NOTE) The CDC recommends that a Reactive HCV antibody result be followed up  with a HCV Nucleic Acid Amplification test.     Hep A IgM 01/23/2022 NON REACTIVE  NON REACTIVE Final   Hep B C IgM 01/23/2022 NON REACTIVE  NON REACTIVE Final   Performed at [facility], [address], [place], [place] [address]   RPR Ser Ql 01/23/2022 NON REACTIVE  NON REACTIVE Final   Performed at [facility], [address], [place], [place] [address]   HIV Screen 4th Generation wRfx 01/23/2022 Non Reactive  Non Reactive Final   Performed at [facility], [address] [place], [place] [address]   Preg Test, Ur 01/24/2022 Negative  Negative Final   Color, Urine 01/24/2022 AMBER (A)  YELLOW Final   BIOCHEMICALS MAY BE AFFECTED BY COLOR   APPearance 01/24/2022 HAZY  (A)  CLEAR Final   Specific Gravity, Urine 01/24/2022 1.030  1.005 - 1.030 Final   pH 01/24/2022 5.0  5.0 - 8.0 Final   Glucose, UA 01/24/2022 NEGATIVE  NEGATIVE mg/dL Final   Hgb urine dipstick 01/24/2022 NEGATIVE  NEGATIVE Final   Bilirubin Urine 01/24/2022 NEGATIVE  NEGATIVE Final   Ketones, ur 01/24/2022 NEGATIVE  NEGATIVE mg/dL Final   Protein, ur 18/84/1660 NEGATIVE  NEGATIVE mg/dL Final   Nitrite 63/02/6008 NEGATIVE  NEGATIVE Final   Leukocytes,Ua 01/24/2022 NEGATIVE  NEGATIVE Final   Performed at [facility], [address] [place], [place] [address]   POC Amphetamine UR 01/24/2022 None Detected  NONE DETECTED (Cut Off Level 1000 ng/mL) Final   POC Secobarbital (BAR) 01/24/2022 None Detected  NONE DETECTED (Cut Off Level 300 ng/mL) Final   POC Buprenorphine (BUP) 01/24/2022 None Detected  NONE DETECTED (Cut Off Level 10 ng/mL) Final   POC Oxazepam (BZO) 01/24/2022 Positive (A)  NONE DETECTED (Cut Off Level 300 ng/mL) Final   POC Cocaine UR 01/24/2022 Positive (A)  NONE DETECTED (Cut Off Level 300 ng/mL) Final   POC Methamphetamine UR 01/24/2022 None Detected  NONE DETECTED (Cut Off Level 1000 ng/mL) Final   POC Morphine 01/24/2022 None Detected  NONE DETECTED (Cut Off Level 300 ng/mL) Final   POC Methadone UR 01/24/2022 None Detected  NONE DETECTED (Cut Off Level 300 ng/mL) Final   POC Oxycodone UR 01/24/2022 None Detected  NONE DETECTED (Cut Off Level 100 ng/mL) Final   POC Marijuana UR 01/24/2022 Positive (A)  NONE DETECTED (Cut Off Level 50 ng/mL) Final   SARSCOV2ONAVIRUS 2 AG 01/23/2022 NEGATIVE  NEGATIVE Final   Comment: (NOTE) SARS-CoV-2 antigen NOT DETECTED.  Negative results are presumptive.  Negative results do not preclude SARS-CoV-2 infection and should not be used as the sole basis for treatment or other patient management decisions, including infection  control decisions, particularly in the presence of clinical signs and  symptoms consistent with COVID-19,  or in those who have been in contact with the virus.  Negative results must be combined with clinical observations, patient history, and epidemiological information. The expected result is Negative.  Fact Sheet for Patients: https://www.jennings-kim.com/https://www.fda.gov/media/141569/download  Fact Sheet for Healthcare Providers: https://alexander-rogers.biz/https://www.fda.gov/media/141568/download  This test is not yet approved or cleared by the [place] FDA and  has been authorized for detection and/or diagnosis of SARS-CoV-2 by FDA under an Emergency Use Authorization (EUA).  This EUA will remain in effect (meaning this test can be used) for the duration of  the COV                          ID-19 declaration under Section 564(b)(1) of the Act, 21 U.S.C. section 360bbb-3(b)(1), unless the authorization is terminated or revoked sooner.     HCV RNA, Quantitation 01/23/2022 See Final Results  IU/mL Final   Test Information: 01/23/2022 Comment   Final   Comment: (NOTE) The quantitative range of this assay is 15 IU/mL to 100 million IU/mL. Performed At: [facility] [address] [place], [place] [id number] Jolene SchimkeNagendra Sanjai MD WU:[id number]:[phone]    Preg Test, Ur 01/24/2022 NEGATIVE  NEGATIVE Final   Comment:        THE SENSITIVITY OF THIS METHODOLOGY IS >24 mIU/mL    HCV RNA - Quantitation 01/23/2022 9,270,000  IU/mL Final   Comment: (NOTE)                HCV RNA detected HCV RNA viral loads >/= 25 IU/mL indicate current HCV infection.    HCV RNA - log10 01/23/2022 6.967  log10 IU/mL Final   Comment: (NOTE) Performed At: [facility] [address] [place], [place] [id number] Jolene SchimkeNagendra Sanjai MD VH:[id number]:[phone]     Blood Alcohol level:  Lab Results  Component Value Date   [facility] 01/23/2022   ETH <5 02/22/2014    Metabolic Disorder Labs: Lab Results  Component Value Date   HGBA1C 5.4 01/23/2022   MPG 108 01/23/2022   No results found for: "PROLACTIN" Lab Results  Component Value Date   CHOL 194  01/23/2022   TRIG 145 01/23/2022   HDL 36 (L) 01/23/2022   CHOLHDL 5.4 01/23/2022   VLDL 29 01/23/2022   LDLCALC 129 (H) 01/23/2022    Therapeutic Lab Levels: No results found for: "LITHIUM" Lab Results  Component Value Date   VALPROATE <10.0 (L) 12/31/2006   No results found for: "CBMZ"  Physical Findings   PHQ2-9    [facility]  PHQ-2 Total Score 4  PHQ-9 Total Score 11      Flowsheet Row [facility]  C-SSRS RISK CATEGORY No Risk        Musculoskeletal  Strength & Muscle Tone: within normal limits Gait & Station: normal Patient leans: N/A  Psychiatric Specialty Exam  Presentation  General Appearance:  Appropriate for Environment; Casual  Eye Contact: Good  Speech: Clear and Coherent; Normal Rate  Speech Volume: Normal  Handedness:No data recorded  Mood and Affect  Mood: Euthymic  Affect: Appropriate; Congruent   Thought Process  Thought Processes: Coherent; Goal Directed  Descriptions of Associations:Intact  Orientation:Full (Time, Place and Person)  Thought Content:Logical  Diagnosis of Schizophrenia or Schizoaffective disorder in past: No    Hallucinations:Hallucinations: None   Ideas of Reference:None  Suicidal Thoughts:Suicidal Thoughts: No   Homicidal Thoughts:Homicidal Thoughts: No    Sensorium  Memory: Immediate Good; Recent Good  Judgment: Fair  Insight: Fair   Art therapist  Concentration: Good  Attention Span: Good  Recall: Good  Fund of Knowledge: Good  Language: Good   Psychomotor Activity  Psychomotor Activity: Psychomotor Activity: Normal    Assets  Assets: Communication Skills; Desire for Improvement; Leisure Time; Physical Health; Social Support   Sleep  Sleep: Sleep: Fair    No data recorded   Physical Exam  Physical Exam Vitals reviewed.   Constitutional:      General: She is not in acute distress. HENT:     Head: Normocephalic and atraumatic.     Mouth/Throat:     Mouth: Mucous membranes are moist.     Pharynx: Oropharynx is clear.  Pulmonary:     Effort: Pulmonary effort is normal.  Skin:    General: Skin is warm and dry.  Neurological:     General: No focal deficit present.     Mental Status: She is alert.     Gait: Gait normal.    Review of Systems  Constitutional:  Positive for malaise/fatigue. Negative for chills and diaphoresis.  Cardiovascular:  Negative for chest pain and palpitations.  Gastrointestinal:  Positive for constipation. Negative for diarrhea, nausea and vomiting.  Genitourinary: Negative.   Musculoskeletal:  Negative for myalgias.  Neurological:  Negative for weakness and headaches.   Blood pressure 108/80, pulse 65, temperature 98.4 F (36.9 C), temperature source Oral, resp. rate 18, SpO2 99 %. There is no height or weight on file to calculate BMI.  Treatment Plan Summary: Daily contact with patient to assess and evaluate symptoms and progress in treatment and Medication management  #Opioid use disorder #Stimulant Use Disorder -Continue COWS monitoring -Continue 0.1 mg Clonidine taper for withdrawal symptoms -Continue Bentyl 20 mg every 6H PRN for  spasm, abdominal cramping x5 days -Continue Robaxin 500 mg every 8 hours as needed for muscle spasm x5 days -Continue naproxen 500 mg twice daily as needed for aching, pain or discomfort x5 days. -Continue Zofran 4 mg every 6 hours as needed for nausea or vomiting. -Continue Imodium 2 to 4 mg as needed for diarrhea or loose stools for 72 hours.  -Continue hydroxyzine 25 mg every 6 hours as needed for anxiety   #Nicotine Use Disorder -NRT  #MDD -Continue Wellbutrin XL 150 mg daily. Also beneficial for patient's history of ADHD but primary complaint of decreased energy.   #Active HCV infection HCV Ab reactive; HCV RNA Quantitation=  9,270,000, current HCV infection. APRI (AST to platelet ratio index)= 0.43, unlikely cirrhosis, significant fibrosis possible. FIB-4= 1.23, cirrhosis less likely. Renal function WNL. No HIV nor HBV coinfection.  Consulted medicine to determine best treatment option. Medicine consult- Advised patient to be seen in [facility]. Where they will genotype and figure out if have compensated or decompensated cirrhosis. Will put in referral    Dispo: Discharge to [facility] tomorrow at 2 PM.  Lamar Sprinkles, MD 01/25/2022 4:25 PM

## 2022-01-25 NOTE — ED Notes (Signed)
Patient in room - no sxs of distress - will continue to monitor for safety - refuses AA

## 2022-01-25 NOTE — Discharge Planning (Signed)
LCSW received update from Admissions Coordinator [facility] with [facility] [phone] regarding referral. Phone screening was complete with patient and insurance verified, and patient meets criteria for admission into their facility. [name] reports he will follow up with his Director regarding admission date and time, and then follow up with LCSW to provide update.   [name], LCSW Clinical Social Worker [place] [facility] Ph: [phone]

## 2022-01-25 NOTE — Discharge Planning (Signed)
Shelly George is able to admit to [facility] on tomorrow 12/9. [name] [phone] stated transport will leave out about 12:00pm and should arrive here around 2:00pm. Patient provided update and expressed great appreciation for [facility] assistance with securing placement for her recovery.   [name], LCSW Clinical Social Worker [facility] Ph: [phone]

## 2022-01-25 NOTE — ED Notes (Signed)
Snacks given 

## 2022-01-25 NOTE — ED Notes (Signed)
Patient is calm and cooperative and very excited to be going to "rehab". She states the "I am ready " . Sitting in the day room talking to other patients. Will continue to monitor for safety

## 2022-01-25 NOTE — Group Note (Signed)
Group Topic: Recovery Basics  Group Date: 01/25/2022 Start Time: 1030 End Time: 1145 Facilitators: Doyne Keel E  Department: [facility]  Number of Participants: 2  Group Focus: affirmation, chemical dependency education, coping skills, and substance abuse education Treatment Modality:  Behavior Modification Therapy Interventions utilized were patient education and problem solving Purpose: enhance coping skills, regain self-worth, relapse prevention strategies, and trigger / craving management  Name: ILMA ACHEE Date of Birth: 1981/01/17  MR: [id number]    Level of Participation: active Quality of Participation: attentive and cooperative Interactions with others: gave feedback Mood/Affect: appropriate Triggers (if applicable): n/a Cognition: coherent/clear Progress: Moderate Response: n/a Plan: follow-up needed  Patients Problems:  Patient Active Problem List   Diagnosis Date Noted   Opiate addiction (HCC) 01/23/2022   Polysubstance abuse (HCC)

## 2022-01-25 NOTE — ED Notes (Signed)
In a group meeting 

## 2022-01-25 NOTE — Progress Notes (Signed)
Received Shelly George this AM asleep in her room, she woke up, ate breakfast and attended the morning group sessions. She was compliant with her medications. Later she showered and changed her clothes. She remained OOB in the milieu throughout the morning.

## 2022-01-26 DIAGNOSIS — F1124 Opioid dependence with opioid-induced mood disorder: Secondary | ICD-10-CM | POA: Diagnosis not present

## 2022-01-26 MED ORDER — BUPROPION HCL ER (XL) 150 MG PO TB24: 150.0000 mg | ORAL_TABLET | Freq: Every day | ORAL | 0 refills | Status: AC

## 2022-01-26 MED ORDER — NICOTINE 21 MG/24HR TD PT24: 21.0000 mg | MEDICATED_PATCH | Freq: Every day | TRANSDERMAL | 0 refills | Status: AC

## 2022-01-26 NOTE — ED Notes (Signed)
Patient is resting quietly with eyes closed no S/S of distress will continue to monitor for safety,

## 2022-01-26 NOTE — ED Provider Notes (Addendum)
[facility]BC/OBS ASAP Discharge Summary  Date and Time: 01/26/2022 10:15 AM  Name: Shelly George  MRN:  [id number]   Discharge Diagnoses:  Final diagnoses:  Opioid dependence with opioid-induced mood disorder (HCC)  Cocaine use disorder, severe, in controlled environment (HCC)    Subjective:  Shelly George is a 41 yr old female [facility] with Detox and Rehab for Opiate and Cocaine use, [facility]  PPHx is significant for Opiate Use Disorder, Stimulant Use Disorder, and ADHD, and no history of Suicide Attempts, Self Injurious Behavior, or Psychiatric Hospitalizations.   She reports that she feels okay today.  She reports she is looking forward to going to [place] to begin the rehab.  She reports no symptoms of withdrawal today.  She reports no cravings today.  Discussed with her that referral to [facility] had placed and that the contact information will be provided in her discharge paperwork so that she can call to schedule follow-up appointment given her positive testing results.  She reported understanding and had no concerns.  Discussed that she would be provided with samples and a prescription for her prescriptions.  She reports no SI, HI, or AVH.  She reports sleep is good.  She contracted doing good.  She reports no other concerns at present.   Stay Summary:  [facility] and was started on a Clonidine taper.  She was started on Wellbutrin for depression and ADHD on 12/7 and tolerated it well.  She was found to have chronic Hep C and so Internal Medicine was consulted and recommended a referral to [facility] which was placed and she was provided with information to schedule follow up.  [facility] and she was discharged and picked up on 12/9.   Total Time spent with patient:  I personally spent 20 minutes on the unit in direct patient care. The direct patient care time included  face-to-face time with the patient, reviewing the patient's chart, communicating with other professionals, and coordinating care. Greater than 50% of this time was spent in counseling or coordinating care with the patient regarding goals of hospitalization, psycho-education, and discharge planning needs.   Past Psychiatric History: Opiate Use Disorder, Stimulant Use Disorder, and ADHD, and no history of Suicide Attempts, Self Injurious Behavior, or Psychiatric Hospitalizations.  Past Medical History:  Past Medical History:  Diagnosis Date   Anxiety    Drug abuse and dependence (HCC)    opiates, suboxone   Seizure (HCC)    No past surgical history on file. Family History:  Family History  Problem Relation Age of Onset   Cancer Mother    Family Psychiatric History: None Reported Social History:  Social History   Substance and Sexual Activity  Alcohol Use Yes     Social History   Substance and Sexual Activity  Drug Use Yes   Types: Oxycodone   Comment: suboxene-street drug used 5 days ago    Social History   Socioeconomic History   Marital status: Legally Separated    Spouse name: Not on file   Number of children: Not on file   Years of education: Not on file   Highest education level: Not on file  Occupational History   Not on file  Tobacco Use   Smoking status: Every Day    Packs/day: 0.50    Types: Cigarettes   Smokeless tobacco: Never  Substance  and Sexual Activity   Alcohol use: Yes   Drug use: Yes    Types: Oxycodone    Comment: suboxene-street drug used 5 days ago   Sexual activity: Yes  Other Topics Concern   Not on file  Social History Narrative   Not on file   Social Determinants of Health   Financial Resource Strain: Not on file  Food Insecurity: Not on file  Transportation Needs: Not on file  Physical Activity: Not on file  Stress: Not on file  Social Connections: Not on file   SDOH:  SDOH Screenings   Depression (PHQ2-9): High Risk  (01/24/2022)  Tobacco Use: High Risk (12/31/2018)    Tobacco Cessation:  A prescription for an FDA-approved tobacco cessation medication provided at discharge  Current Medications:  Current Facility-Administered Medications  Medication Dose Route Frequency Provider Last Rate Last Admin   acetaminophen (TYLENOL) tablet 650 mg  650 mg Oral Q6H PRN Park Pope, MD       alum & mag hydroxide-simeth (MAALOX/MYLANTA) 200-200-20 MG/5ML suspension 30 mL  30 mL Oral Q4H PRN Park Pope, MD       buPROPion (WELLBUTRIN XL) 24 hr tablet 150 mg  150 mg Oral Daily Lamar Sprinkles, MD   150 mg at 01/26/22 0859   cloNIDine (CATAPRES) tablet 0.1 mg  0.1 mg Oral Sabino Snipes, MD   0.1 mg at 01/26/22 8299   Followed by   Melene Muller ON 01/28/2022] cloNIDine (CATAPRES) tablet 0.1 mg  0.1 mg Oral QAC breakfast Park Pope, MD       dicyclomine (BENTYL) tablet 20 mg  20 mg Oral Q6H PRN Park Pope, MD       hydrOXYzine (ATARAX) tablet 25 mg  25 mg Oral Q6H PRN Park Pope, MD   25 mg at 01/25/22 2130   loperamide (IMODIUM) capsule 2-4 mg  2-4 mg Oral PRN Park Pope, MD       magnesium hydroxide (MILK OF MAGNESIA) suspension 30 mL  30 mL Oral Daily PRN Park Pope, MD       methocarbamol (ROBAXIN) tablet 500 mg  500 mg Oral Q8H PRN Park Pope, MD       naproxen (NAPROSYN) tablet 500 mg  500 mg Oral BID PRN Park Pope, MD   500 mg at 01/25/22 0304   nicotine (NICODERM CQ - dosed in mg/24 hours) patch 21 mg  21 mg Transdermal Daily Park Pope, MD   21 mg at 01/26/22 0859   nicotine polacrilex (NICORETTE) gum 2 mg  2 mg Oral PRN Park Pope, MD       ondansetron (ZOFRAN-ODT) disintegrating tablet 4 mg  4 mg Oral Q6H PRN Park Pope, MD       Current Outpatient Medications  Medication Sig Dispense Refill   buPROPion (WELLBUTRIN XL) 150 MG 24 hr tablet Take 1 tablet (150 mg total) by mouth daily. 30 tablet 0   nicotine (NICODERM CQ - DOSED IN MG/24 HOURS) 21 mg/24hr patch Place 1 patch (21 mg total) onto the skin daily. 14  patch 0    PTA Medications: (Not in a hospital admission)      01/26/2022   10:15 AM 01/24/2022    8:29 AM 01/23/2022    3:17 PM  Depression screen PHQ 2/9  Decreased Interest 2 3 0  Down, Depressed, Hopeless 0 1 1  PHQ - 2 Score 2 4 1   Altered sleeping 1 3   Tired, decreased energy 2 3   Change in appetite 0 1  Feeling bad or failure about yourself  0    Trouble concentrating 2    Moving slowly or fidgety/restless 0 0   Suicidal thoughts 0 0   PHQ-9 Score 7 11   Difficult doing work/chores Somewhat difficult Somewhat difficult     Flowsheet Row ED from 01/23/2022 in [facility]  C-SSRS RISK CATEGORY No Risk       Musculoskeletal  Strength & Muscle Tone: within normal limits Gait & Station: normal Patient leans: N/A  Psychiatric Specialty Exam  Presentation  General Appearance:  Appropriate for Environment; Casual  Eye Contact: Good  Speech: Clear and Coherent; Normal Rate  Speech Volume: Normal  Handedness:Right   Mood and Affect  Mood: Euthymic  Affect: Appropriate; Congruent   Thought Process  Thought Processes: Coherent; Goal Directed  Descriptions of Associations:Intact  Orientation:Full (Time, Place and Person)  Thought Content:Logical  Diagnosis of Schizophrenia or Schizoaffective disorder in past: No    Hallucinations:Hallucinations: None  Ideas of Reference:None  Suicidal Thoughts:Suicidal Thoughts: No  Homicidal Thoughts:Homicidal Thoughts: No   Sensorium  Memory: Immediate Good; Recent Good  Judgment: Good  Insight: Good   Executive Functions  Concentration: Good  Attention Span: Good  Recall: Good  Fund of Knowledge: Good  Language: Good   Psychomotor Activity  Psychomotor Activity: Psychomotor Activity: Normal   Assets  Assets: Communication Skills; Desire for Improvement; Leisure Time; Physical Health; Social Support; Resilience   Sleep  Sleep: Sleep:  Good   No data recorded  Physical Exam  Physical Exam Vitals and nursing note reviewed.  Constitutional:      General: She is not in acute distress.    Appearance: Normal appearance. She is normal weight. She is not ill-appearing or toxic-appearing.  HENT:     Head: Normocephalic and atraumatic.  Pulmonary:     Effort: Pulmonary effort is normal.  Musculoskeletal:        General: Normal range of motion.  Neurological:     General: No focal deficit present.     Mental Status: She is alert.    Review of Systems  Respiratory:  Negative for cough and shortness of breath.   Cardiovascular:  Negative for chest pain.  Gastrointestinal:  Negative for abdominal pain, constipation, diarrhea, nausea and vomiting.  Neurological:  Negative for dizziness, weakness and headaches.  Psychiatric/Behavioral:  Negative for depression, hallucinations and suicidal ideas. The patient is not nervous/anxious.    Blood pressure 120/78, pulse (!) 52, temperature 98.3 F (36.8 C), temperature source Tympanic, resp. rate 18, SpO2 100 %. There is no height or weight on file to calculate BMI.  Demographic Factors:  Caucasian  Loss Factors: NA  Historical Factors: NA  Risk Reduction Factors:   Positive coping skills or problem solving skills  Continued Clinical Symptoms:  Alcohol/Substance Abuse/Dependencies More than one psychiatric diagnosis  Cognitive Features That Contribute To Risk:  None    Suicide Risk:  Minimal: No identifiable suicidal ideation.  Patients presenting with no risk factors but with morbid ruminations; may be classified as minimal risk based on the severity of the depressive symptoms  Plan Of Care/Follow-up recommendations/Disposition:  You will be discharged to [facility].    Activity: as tolerated  Diet: heart healthy  Other: -Follow-up with your outpatient psychiatric provider -instructions on appointment date, time, and  address (location) are provided to you in discharge paperwork.  -Take your psychiatric medications as prescribed at discharge - instructions are provided to you in the  discharge paperwork  -Follow-up with outpatient primary care doctor and other specialists -for management of chronic medical disease, including: Hep C, a referral to [facility] has been placed and you have been given the contact information for the clinic to make an appointment.  -Testing: Follow-up with outpatient provider for abnormal lab results: Positive Hep C result, a referral to [facility] has been placed and you have been given the contact information for the clinic to make an appointment.  -Recommend abstinence from alcohol, tobacco, and other illicit drug use at discharge.   -If your psychiatric symptoms recur, worsen, or if you have side effects to your psychiatric medications, call your outpatient psychiatric provider, [phone] or go to the nearest emergency department.  -If suicidal thoughts recur, call your outpatient psychiatric provider, 911, [phone] or go to the nearest emergency department.   Lauro Franklin, MD 01/26/2022, 10:15 AM

## 2022-01-26 NOTE — Progress Notes (Signed)
Patient continues to be calm on unit.  She is patiently waiting for representatives from [place] to arrive and pick her up.  RN called facility and the driver was in traffic.  She is on her way and will apparently be here by 4pm.  Patient is aware and reassured.

## 2022-01-26 NOTE — ED Notes (Signed)
Patient presently asleep in bed without distress or complaint.  Will monitor.

## 2022-01-26 NOTE — ED Notes (Signed)
Patient continues to rest with no sxs of distress noted - will continue to monitor for safety 

## 2022-01-28 DIAGNOSIS — Z79899 Other long term (current) drug therapy: Secondary | ICD-10-CM | POA: Diagnosis not present

## 2022-01-28 DIAGNOSIS — R69 Illness, unspecified: Secondary | ICD-10-CM | POA: Diagnosis not present

## 2022-01-28 DIAGNOSIS — F1020 Alcohol dependence, uncomplicated: Secondary | ICD-10-CM | POA: Diagnosis not present

## 2022-01-29 DIAGNOSIS — R69 Illness, unspecified: Secondary | ICD-10-CM | POA: Diagnosis not present

## 2022-01-30 DIAGNOSIS — R69 Illness, unspecified: Secondary | ICD-10-CM | POA: Diagnosis not present

## 2022-01-30 LAB — HCV RNA QUANT

## 2022-01-31 DIAGNOSIS — K760 Fatty (change of) liver, not elsewhere classified: Secondary | ICD-10-CM | POA: Diagnosis not present

## 2022-01-31 DIAGNOSIS — K5900 Constipation, unspecified: Secondary | ICD-10-CM | POA: Diagnosis not present

## 2022-02-01 DIAGNOSIS — R69 Illness, unspecified: Secondary | ICD-10-CM | POA: Diagnosis not present

## 2022-02-02 DIAGNOSIS — R69 Illness, unspecified: Secondary | ICD-10-CM | POA: Diagnosis not present

## 2022-02-04 DIAGNOSIS — R69 Illness, unspecified: Secondary | ICD-10-CM | POA: Diagnosis not present

## 2022-02-05 DIAGNOSIS — R69 Illness, unspecified: Secondary | ICD-10-CM | POA: Diagnosis not present

## 2022-02-06 DIAGNOSIS — Z79899 Other long term (current) drug therapy: Secondary | ICD-10-CM | POA: Diagnosis not present

## 2022-02-06 DIAGNOSIS — F1020 Alcohol dependence, uncomplicated: Secondary | ICD-10-CM | POA: Diagnosis not present

## 2022-02-06 DIAGNOSIS — R69 Illness, unspecified: Secondary | ICD-10-CM | POA: Diagnosis not present

## 2022-02-07 DIAGNOSIS — R69 Illness, unspecified: Secondary | ICD-10-CM | POA: Diagnosis not present

## 2022-02-08 DIAGNOSIS — F1020 Alcohol dependence, uncomplicated: Secondary | ICD-10-CM | POA: Diagnosis not present

## 2022-02-08 DIAGNOSIS — R69 Illness, unspecified: Secondary | ICD-10-CM | POA: Diagnosis not present

## 2022-02-08 DIAGNOSIS — Z79899 Other long term (current) drug therapy: Secondary | ICD-10-CM | POA: Diagnosis not present

## 2022-02-09 DIAGNOSIS — R69 Illness, unspecified: Secondary | ICD-10-CM | POA: Diagnosis not present

## 2022-02-12 DIAGNOSIS — R69 Illness, unspecified: Secondary | ICD-10-CM | POA: Diagnosis not present

## 2022-02-13 DIAGNOSIS — F1020 Alcohol dependence, uncomplicated: Secondary | ICD-10-CM | POA: Diagnosis not present

## 2022-02-13 DIAGNOSIS — F1420 Cocaine dependence, uncomplicated: Secondary | ICD-10-CM | POA: Diagnosis not present

## 2022-02-13 DIAGNOSIS — R69 Illness, unspecified: Secondary | ICD-10-CM | POA: Diagnosis not present

## 2022-02-14 DIAGNOSIS — R69 Illness, unspecified: Secondary | ICD-10-CM | POA: Diagnosis not present

## 2022-02-15 DIAGNOSIS — R69 Illness, unspecified: Secondary | ICD-10-CM | POA: Diagnosis not present

## 2022-02-16 DIAGNOSIS — R69 Illness, unspecified: Secondary | ICD-10-CM | POA: Diagnosis not present
